# Patient Record
Sex: Female | Born: 1975
Health system: Southern US, Community
[De-identification: ages and names within clinical notes are randomized; demographics above are authoritative.]

## PROBLEM LIST (undated history)

## (undated) DIAGNOSIS — F419 Anxiety disorder, unspecified: Secondary | ICD-10-CM

## (undated) DIAGNOSIS — M797 Fibromyalgia: Secondary | ICD-10-CM

## (undated) DIAGNOSIS — T7840XA Allergy, unspecified, initial encounter: Secondary | ICD-10-CM

## (undated) HISTORY — DX: Anxiety disorder, unspecified: F41.9

## (undated) HISTORY — DX: Allergy, unspecified, initial encounter: T78.40XA

## (undated) HISTORY — PX: CHOLECYSTECTOMY: SHX55

---

## 2009-04-30 ENCOUNTER — Inpatient Hospital Stay (HOSPITAL_COMMUNITY): Admission: AD | Admit: 2009-04-30 | Discharge: 2009-05-02 | Payer: Self-pay | Admitting: Obstetrics and Gynecology

## 2010-09-01 ENCOUNTER — Encounter
Admission: RE | Admit: 2010-09-01 | Discharge: 2010-09-01 | Payer: Self-pay | Source: Home / Self Care | Attending: Family Medicine | Admitting: Family Medicine

## 2010-11-20 LAB — CBC
HCT: 30.1 % — ABNORMAL LOW (ref 36.0–46.0)
HCT: 30.7 % — ABNORMAL LOW (ref 36.0–46.0)
Hemoglobin: 10.5 g/dL — ABNORMAL LOW (ref 12.0–15.0)
Hemoglobin: 11.3 g/dL — ABNORMAL LOW (ref 12.0–15.0)
MCHC: 34.5 g/dL (ref 30.0–36.0)
MCHC: 34.7 g/dL (ref 30.0–36.0)
MCHC: 34.5 g/dL (ref 30.0–36.0)
MCV: 106.3 fL — ABNORMAL HIGH (ref 78.0–100.0)
MCV: 105.1 fL — ABNORMAL HIGH (ref 78.0–100.0)
Platelets: 111 10*3/uL — ABNORMAL LOW (ref 150–400)
Platelets: 142 10*3/uL — ABNORMAL LOW (ref 150–400)
RBC: 3.1 MIL/uL — ABNORMAL LOW (ref 3.87–5.11)
RDW: 12.9 % (ref 11.5–15.5)
RDW: 13.2 % (ref 11.5–15.5)
WBC: 6.1 10*3/uL (ref 4.0–10.5)

## 2010-11-20 LAB — DIFFERENTIAL
Basophils Absolute: 0 10*3/uL (ref 0.0–0.1)
Basophils Absolute: 0 10*3/uL (ref 0.0–0.1)
Basophils Relative: 0 % (ref 0–1)
Basophils Relative: 0 % (ref 0–1)
Eosinophils Absolute: 0.2 10*3/uL (ref 0.0–0.7)
Eosinophils Absolute: 0 10*3/uL (ref 0.0–0.7)
Eosinophils Relative: 0 % (ref 0–5)
Eosinophils Relative: 1 % (ref 0–5)
Lymphocytes Relative: 7 % — ABNORMAL LOW (ref 12–46)
Lymphocytes Relative: 9 % — ABNORMAL LOW (ref 12–46)
Lymphs Abs: 0.7 10*3/uL (ref 0.7–4.0)
Monocytes Absolute: 1 10*3/uL (ref 0.1–1.0)
Monocytes Absolute: 0.7 10*3/uL (ref 0.1–1.0)
Monocytes Relative: 5 % (ref 3–12)

## 2010-11-20 LAB — URINE CULTURE: Special Requests: NEGATIVE

## 2010-11-20 LAB — CULTURE, BLOOD (ROUTINE X 2): Culture: NO GROWTH

## 2010-11-20 LAB — RPR: RPR Ser Ql: NONREACTIVE

## 2011-07-29 ENCOUNTER — Ambulatory Visit (INDEPENDENT_AMBULATORY_CARE_PROVIDER_SITE_OTHER): Payer: BC Managed Care – PPO

## 2011-07-29 DIAGNOSIS — IMO0001 Reserved for inherently not codable concepts without codable children: Secondary | ICD-10-CM

## 2011-07-29 DIAGNOSIS — R059 Cough, unspecified: Secondary | ICD-10-CM

## 2011-07-29 DIAGNOSIS — R05 Cough: Secondary | ICD-10-CM

## 2011-07-29 DIAGNOSIS — J019 Acute sinusitis, unspecified: Secondary | ICD-10-CM

## 2011-09-05 ENCOUNTER — Emergency Department (HOSPITAL_COMMUNITY)
Admission: EM | Admit: 2011-09-05 | Discharge: 2011-09-05 | Disposition: A | Discharge status: Home / Self Care | Payer: BC Managed Care – PPO | Attending: Emergency Medicine | Admitting: Emergency Medicine

## 2011-09-05 ENCOUNTER — Encounter (HOSPITAL_COMMUNITY): Payer: Self-pay | Admitting: *Deleted

## 2011-09-05 DIAGNOSIS — K529 Noninfective gastroenteritis and colitis, unspecified: Secondary | ICD-10-CM

## 2011-09-05 DIAGNOSIS — R112 Nausea with vomiting, unspecified: Secondary | ICD-10-CM | POA: Insufficient documentation

## 2011-09-05 DIAGNOSIS — K5289 Other specified noninfective gastroenteritis and colitis: Secondary | ICD-10-CM | POA: Insufficient documentation

## 2011-09-05 DIAGNOSIS — R197 Diarrhea, unspecified: Secondary | ICD-10-CM | POA: Insufficient documentation

## 2011-09-05 HISTORY — DX: Fibromyalgia: M79.7

## 2011-09-05 MED ORDER — ONDANSETRON 8 MG PO TBDP: 8.0000 mg | ORAL_TABLET | Freq: Three times a day (TID) | ORAL | Status: AC | PRN

## 2011-09-05 MED ORDER — ONDANSETRON HCL 4 MG/2ML IJ SOLN
INTRAMUSCULAR | Status: AC
  Administered 2011-09-05: 4 mg
  Filled 2011-09-05: qty 2

## 2011-09-05 NOTE — ED Provider Notes (Signed)
History     CSN: [id number]  Arrival date & time 09/05/11  0711   First MD Initiated Contact with Patient 09/05/11 0725      Chief Complaint  Patient presents with  . N/V/D     (Consider location/radiation/quality/duration/timing/severity/associated sxs/prior treatment) HPI Jane Fletcher is a 36 y.o. female presents with c/o N/V/D leading to desire to be assessed in the ED. The sx(s) have been present for 6 hours. Additional concerns are nothing. 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 Palliative factors are nothing. The distress associated is mild. The disorder has been present for 6 hours. Past Medical History  Diagnosis Date  . Fibromyalgia     Past Surgical History  Procedure Date  . Cholecystectomy     History reviewed. No pertinent family history.  History  Substance Use Topics  . Smoking status: Never Smoker   . Smokeless tobacco: Not on file  . Alcohol Use: No    OB History    Grav Para Term Preterm Abortions TAB SAB Ect Mult Living                  Review of Systems  All other systems reviewed and are negative.    Allergies  Penicillins  Home Medications   Current Outpatient Rx  Name Route Sig Dispense Refill  . NAPROXEN 500 MG PO TABS Oral Take 500 mg by mouth 2 (two) times daily as needed. For pain    . PSEUDO PO Oral Take 1 tablet by mouth once.    Marland Kitchen ONDANSETRON 8 MG PO TBDP Oral Take 1 tablet (8 mg total) by mouth every 8 (eight) hours as needed for nausea. 12 tablet 0    BP 127/80  Pulse 108  Temp(Src) 98.2 F (36.8 C) (Oral)  Resp 16  SpO2 97%  Physical Exam  Nursing note and vitals reviewed. Constitutional: She is oriented to person, place, and time. She appears well-developed and well-nourished.  HENT:  Head: Normocephalic and atraumatic.  Eyes: Conjunctivae and EOM are normal. Pupils are equal, round, and reactive to light.  Neck: Normal range of motion and phonation normal. Neck supple.  Cardiovascular:  Normal rate, regular rhythm and intact distal pulses.   Pulmonary/Chest: Effort normal and breath sounds normal. She exhibits no tenderness.  Abdominal: Soft. She exhibits no distension. There is no tenderness. There is no guarding.  Musculoskeletal: Normal range of motion.  Neurological: She is alert and oriented to person, place, and time. She has normal strength. She exhibits normal muscle tone.  Skin: Skin is warm and dry.  Psychiatric: She has a normal mood and affect. Her behavior is normal. Judgment and thought content normal.    ED Course  Procedures (including critical care time) ED Treatment: IVF, Zofran- Pt feels better- 08:11 10:00- Tolerating oral fluids.  Labs Reviewed - No data to display No results found.   1. Gastroenteritis       MDM  Evaluation consistent with acute gastroenteritis, patient is stable for d/c. Doubt metabolic instability, serious bacterial infection or dehydration        Flint Melter, MD 09/05/11 1010

## 2011-09-05 NOTE — ED Notes (Signed)
Pt given fluids and tolerated well.  

## 2011-09-05 NOTE — ED Notes (Signed)
Pt reports diarrhea, multiple episodes of vomiting, and fatigue since this am. Pt reports flu like symptoms x a couple days.

## 2012-01-18 ENCOUNTER — Ambulatory Visit: Payer: Self-pay | Admitting: Family Medicine

## 2012-01-18 VITALS — BP 130/86 | HR 92 | Temp 98.6°F | Resp 18 | Ht 63.0 in

## 2012-01-18 DIAGNOSIS — H109 Unspecified conjunctivitis: Secondary | ICD-10-CM

## 2012-01-18 DIAGNOSIS — L0390 Cellulitis, unspecified: Secondary | ICD-10-CM

## 2012-01-18 DIAGNOSIS — L0291 Cutaneous abscess, unspecified: Secondary | ICD-10-CM

## 2012-01-18 MED ORDER — CIPROFLOXACIN HCL 0.3 % OP SOLN: 1.0000 [drp] | OPHTHALMIC | Status: AC

## 2012-01-18 MED ORDER — DOXYCYCLINE HYCLATE 100 MG PO CAPS: 100.0000 mg | ORAL_CAPSULE | Freq: Two times a day (BID) | ORAL | Status: AC

## 2012-01-18 NOTE — Progress Notes (Signed)
   Patient Name: Jane Fletcher Date of Birth: 1976/07/10 Medical Record Number: [id number] Gender: female Date of Encounter: 01/18/2012  History of Present Illness:  Jane Fletcher is a 36 y.o. very pleasant female patient who presents with the following:  She has noted excessive tearing of her left eye for a couple of weeks, then she noted crusting in the am for the last 2 days.  No corrective lenses used.  She notes that her left eye vision is a little blurry at times, but she can blink matter out of the way and be better.  She notes some"throbbing" on the left side of her face which just started today.  No photophobia.  The eye is tender and she notes a "knot" at the nasal corner of her eye.   No other URI symptoms, no fever noted.    LMP 01/10/12  There is no problem list on file for this patient.  Past Medical History  Diagnosis Date  . Fibromyalgia    Past Surgical History  Procedure Date  . Cholecystectomy    History  Substance Use Topics  . Smoking status: Never Smoker   . Smokeless tobacco: Not on file  . Alcohol Use: No   No family history on file. Allergies  Allergen Reactions  . Penicillins Hives    Medication list has been reviewed and updated.  Prior to Admission medications   Medication Sig Start Date End Date Taking? Authorizing Provider  naproxen (NAPROSYN) 500 MG tablet Take 500 mg by mouth 2 (two) times daily as needed. For pain    Historical Provider, MD  Pseudoephedrine HCl (PSEUDO PO) Take 1 tablet by mouth once.    Historical Provider, MD    Review of Systems:  As per HPI- otherwise negative.  Physical Examination: Filed Vitals:   01/18/12 1037  BP: 130/86  Pulse: 92  Temp: 98.6 F (37 C)  Resp: 18   Filed Vitals:   01/18/12 1037  Height: 5\' 3"  (1.6 m)   There is no weight on file to calculate BMI.  GEN: WDWN, NAD, Non-toxic, A & O x 3 HEENT: Atraumatic, Normocephalic. Neck supple. No masses, No LAD.   TM wnl, oropharynx wnl, nasal  cavity congested PEERL, EOMI, fundoscopic exam wnl, fluorescin stain normal.   She has a tender area at the nasal corner of the eye- suspect a blocked tear duct.  Gentle pressure on the globe is not painful, no pain with moving eye in all directions of gaze Ears and Nose: No external deformity. CV: RRR, No M/G/R. No JVD. No thrill. No extra heart sounds. PULM: CTA B, no wheezes, crackles, rhonchi. No retractions. No resp. distress. No accessory muscle use. EXTR: No c/c/e NEURO Normal gait.  PSYCH: Normally interactive. Conversant. Not depressed or anxious appearing.  Calm demeanor.   Assessment and Plan: 1. Conjunctivitis  ciprofloxacin (CILOXAN) 0.3 % ophthalmic solution  2. Cellulitis  doxycycline (VIBRAMYCIN) 100 MG capsule   Will treat as above- suspect that she has a blocked tear duct, and she will use hot compresses and massage also.  Patient (or parent if minor) instructed to return to clinic or call if not better in 2 day(s).  Sooner if worse.      Abbe Amsterdam, MD 01/18/2012 10:58 AM

## 2012-03-21 ENCOUNTER — Telehealth: Payer: Self-pay

## 2012-03-21 NOTE — Telephone Encounter (Signed)
Pt is currently at [facility] for a research study of fibromyalgia , they are needing her last 5 Ov notes as well as labs. I did inform them that we would need a release to be able to fax them that information and they agreed and it should be coming over now. If any questions please call [phone] ext. [phone]

## 2012-03-21 NOTE — Telephone Encounter (Signed)
Faxed over the requested information according to the release to [facility].

## 2012-04-07 ENCOUNTER — Telehealth: Payer: Self-pay

## 2012-04-07 NOTE — Telephone Encounter (Signed)
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  PHONE: [phone]

## 2012-04-09 NOTE — Telephone Encounter (Signed)
There is no reason to test if she does not have evidence of an infection.

## 2012-04-09 NOTE — Telephone Encounter (Signed)
Called pt no vm to leave message

## 2012-04-10 NOTE — Telephone Encounter (Signed)
[facility] notifying patient info below, and to RTC only if has sign of infection.

## 2013-06-23 ENCOUNTER — Emergency Department (HOSPITAL_BASED_OUTPATIENT_CLINIC_OR_DEPARTMENT_OTHER)
Admission: EM | Admit: 2013-06-23 | Discharge: 2013-06-23 | Disposition: A | Discharge status: Home / Self Care | Payer: Self-pay | Attending: Emergency Medicine | Admitting: Emergency Medicine

## 2013-06-23 ENCOUNTER — Encounter (HOSPITAL_BASED_OUTPATIENT_CLINIC_OR_DEPARTMENT_OTHER): Payer: Self-pay | Admitting: Emergency Medicine

## 2013-06-23 ENCOUNTER — Emergency Department (HOSPITAL_BASED_OUTPATIENT_CLINIC_OR_DEPARTMENT_OTHER): Payer: Self-pay

## 2013-06-23 DIAGNOSIS — Z8739 Personal history of other diseases of the musculoskeletal system and connective tissue: Secondary | ICD-10-CM | POA: Insufficient documentation

## 2013-06-23 DIAGNOSIS — Z880 Allergy status to penicillin: Secondary | ICD-10-CM | POA: Insufficient documentation

## 2013-06-23 DIAGNOSIS — J069 Acute upper respiratory infection, unspecified: Secondary | ICD-10-CM | POA: Insufficient documentation

## 2013-06-23 DIAGNOSIS — Z79899 Other long term (current) drug therapy: Secondary | ICD-10-CM | POA: Insufficient documentation

## 2013-06-23 DIAGNOSIS — Z791 Long term (current) use of non-steroidal anti-inflammatories (NSAID): Secondary | ICD-10-CM | POA: Insufficient documentation

## 2013-06-23 MED ORDER — FLUTICASONE PROPIONATE 50 MCG/ACT NA SUSP: 2.0000 | Freq: Every day | NASAL | Status: DC

## 2013-06-23 MED ORDER — BENZONATATE 100 MG PO CAPS: 100.0000 mg | ORAL_CAPSULE | Freq: Three times a day (TID) | ORAL | Status: DC

## 2013-06-23 MED ORDER — GUAIFENESIN ER 600 MG PO TB12: 600.0000 mg | ORAL_TABLET | Freq: Two times a day (BID) | ORAL | Status: DC

## 2013-06-23 NOTE — ED Notes (Signed)
Cough x 1 day, chest pain with deep breath

## 2013-06-23 NOTE — ED Provider Notes (Signed)
CSN: [id number]     Arrival date & time 06/23/13  0509 History   None    Chief Complaint  Patient presents with  . Cough   (Consider location/radiation/quality/duration/timing/severity/associated sxs/prior Treatment) Patient is a 37 y.o. female presenting with cough. The history is provided by the patient.  Cough Cough characteristics:  Non-productive Severity:  Mild Onset quality:  Gradual Duration:  1 day Timing:  Intermittent Progression:  Unchanged Chronicity:  New Smoker: no   Context: sick contacts and upper respiratory infection   Relieved by:  Nothing Worsened by:  Nothing tried Ineffective treatments:  None tried Associated symptoms: rhinorrhea, sinus congestion and sore throat   Associated symptoms: no chest pain, no diaphoresis, no ear pain, no fever, no headaches, no rash, no shortness of breath and no wheezing   Associated symptoms comment:  Only has pain with coughing Risk factors: no recent travel   No rashes no neck pain or stiffness. No f/c/r. No DOE, no SOB no n/v/d.  No leg swelling no OCPs.  Feels facially congested as well.  Past Medical History  Diagnosis Date  . Fibromyalgia    Past Surgical History  Procedure Laterality Date  . Cholecystectomy     No family history on file. History  Substance Use Topics  . Smoking status: Never Smoker   . Smokeless tobacco: Never Used  . Alcohol Use: No   OB History   Grav Para Term Preterm Abortions TAB SAB Ect Mult Living                 Review of Systems  Constitutional: Negative for fever and diaphoresis.  HENT: Positive for postnasal drip, rhinorrhea and sore throat. Negative for ear pain and trouble swallowing.   Respiratory: Positive for cough. Negative for shortness of breath and wheezing.   Cardiovascular: Negative for chest pain, palpitations and leg swelling.  Skin: Negative for rash.  Neurological: Negative for headaches.  All other systems reviewed and are negative.    Allergies   Penicillins  Home Medications   Current Outpatient Rx  Name  Route  Sig  Dispense  Refill  . acetaminophen (TYLENOL) 500 MG tablet   Oral   Take 500 mg by mouth every 6 (six) hours as needed.         . gabapentin (NEURONTIN) 400 MG capsule   Oral   Take 400 mg by mouth 2 (two) times daily.         . meloxicam (MOBIC) 15 MG tablet   Oral   Take 15 mg by mouth daily.         . naproxen (NAPROSYN) 500 MG tablet   Oral   Take 500 mg by mouth 2 (two) times daily as needed. For pain         . Pseudoephedrine HCl (PSEUDO PO)   Oral   Take 1 tablet by mouth once.          BP 152/81  Pulse 95  Temp(Src) 98.1 F (36.7 C)  Resp 20  Ht 5\' 3"  (1.6 m)  Wt 182 lb (82.555 kg)  BMI 32.25 kg/m2  SpO2 98%  LMP 06/06/2013 Physical Exam  Constitutional: She is oriented to person, place, and time. She appears well-developed and well-nourished. No distress.  HENT:  Head: Normocephalic and atraumatic.  Mouth/Throat: Oropharynx is clear and moist.  Cobblestoning and clear drainage consistent with post nasal drip  Eyes: Conjunctivae are normal. Pupils are equal, round, and reactive to light.  Neck: Normal range  of motion. Neck supple.  Cardiovascular: Normal rate, regular rhythm and intact distal pulses.   Pulmonary/Chest: Effort normal and breath sounds normal. No stridor. No respiratory distress. She has no wheezes. She has no rales. She exhibits no tenderness.  Abdominal: Soft. Bowel sounds are normal. There is no tenderness. There is no rebound and no guarding.  Musculoskeletal: Normal range of motion. She exhibits no edema.  Lymphadenopathy:    She has no cervical adenopathy.  Neurological: She is alert and oriented to person, place, and time.  Skin: Skin is warm and dry.    ED Course  Procedures (including critical care time) Labs Review Labs Reviewed - No data to display Imaging Review No results found.  EKG Interpretation   None       MDM  No diagnosis  found. PERC negative wells 0.  As the patient only has pain with coughing and other URI and no SOB, DOE, n/v/d.  Highly doubt cardiac etiology.  This is a constellation of viral symptoms.  Coworker with same will treat symptomatically   Selenia Mihok K Cala Kruckenberg-Rasch, MD 06/23/13 [phone]

## 2013-06-23 NOTE — ED Notes (Signed)
Patient transported to X-ray 

## 2013-06-23 NOTE — ED Notes (Signed)
MD at bedside during triage assessment

## 2013-06-23 NOTE — ED Notes (Signed)
rx x 3 given for tessalon, flonsae and mucinex

## 2014-05-28 ENCOUNTER — Emergency Department (HOSPITAL_BASED_OUTPATIENT_CLINIC_OR_DEPARTMENT_OTHER): Payer: 59

## 2014-05-28 ENCOUNTER — Emergency Department (HOSPITAL_BASED_OUTPATIENT_CLINIC_OR_DEPARTMENT_OTHER)
Admission: EM | Admit: 2014-05-28 | Discharge: 2014-05-28 | Disposition: A | Discharge status: Home / Self Care | Payer: 59 | Attending: Emergency Medicine | Admitting: Emergency Medicine

## 2014-05-28 ENCOUNTER — Other Ambulatory Visit: Payer: Self-pay

## 2014-05-28 ENCOUNTER — Encounter (HOSPITAL_BASED_OUTPATIENT_CLINIC_OR_DEPARTMENT_OTHER): Payer: Self-pay | Admitting: Emergency Medicine

## 2014-05-28 DIAGNOSIS — Z880 Allergy status to penicillin: Secondary | ICD-10-CM | POA: Diagnosis not present

## 2014-05-28 DIAGNOSIS — Z79899 Other long term (current) drug therapy: Secondary | ICD-10-CM | POA: Insufficient documentation

## 2014-05-28 DIAGNOSIS — J069 Acute upper respiratory infection, unspecified: Secondary | ICD-10-CM | POA: Insufficient documentation

## 2014-05-28 DIAGNOSIS — R0789 Other chest pain: Secondary | ICD-10-CM | POA: Diagnosis not present

## 2014-05-28 DIAGNOSIS — Z791 Long term (current) use of non-steroidal anti-inflammatories (NSAID): Secondary | ICD-10-CM | POA: Insufficient documentation

## 2014-05-28 DIAGNOSIS — R05 Cough: Secondary | ICD-10-CM | POA: Diagnosis present

## 2014-05-28 DIAGNOSIS — J018 Other acute sinusitis: Secondary | ICD-10-CM | POA: Insufficient documentation

## 2014-05-28 DIAGNOSIS — Z8739 Personal history of other diseases of the musculoskeletal system and connective tissue: Secondary | ICD-10-CM | POA: Diagnosis not present

## 2014-05-28 MED ORDER — AZITHROMYCIN 250 MG PO TABS: 250.0000 mg | ORAL_TABLET | Freq: Every day | ORAL | Status: DC

## 2014-05-28 MED ORDER — FLUTICASONE PROPIONATE 50 MCG/ACT NA SUSP: 2.0000 | Freq: Every day | NASAL | Status: DC

## 2014-05-28 NOTE — ED Provider Notes (Signed)
CSN: [id number]     Arrival date & time 05/28/14  1314 History   First MD Initiated Contact with Patient 05/28/14 1432     Chief Complaint  Patient presents with  . URI     (Consider location/radiation/quality/duration/timing/severity/associated sxs/prior Treatment) HPI Comments: This is a 38 y/o F with a past medical history of fibromyalgia who presents to the emergency department complaining of cough x1 week, worsening over the past today's. Cough is only occasionally productive with mucus. States she's experiencing chest tightness when she coughs and brains, nasal congestion and sinus pressure. She has tried taking over-the-counter sinus decongestant with minimal relief. Admits to chills without fever. Denies nausea or vomiting. States her whole body is aching.  Patient is a 38 y.o. female presenting with URI. The history is provided by the patient.  URI Presenting symptoms: congestion and cough   Associated symptoms: no wheezing     Past Medical History  Diagnosis Date  . Fibromyalgia    Past Surgical History  Procedure Laterality Date  . Cholecystectomy     No family history on file. History  Substance Use Topics  . Smoking status: Never Smoker   . Smokeless tobacco: Never Used  . Alcohol Use: No   OB History   Grav Para Term Preterm Abortions TAB SAB Ect Mult Living                 Review of Systems  HENT: Positive for congestion and sinus pressure.   Respiratory: Positive for cough and chest tightness. Negative for wheezing.   All other systems reviewed and are negative.     Allergies  Penicillins  Home Medications   Prior to Admission medications   Medication Sig Start Date End Date Taking? Authorizing Provider  acetaminophen (TYLENOL) 500 MG tablet Take 500 mg by mouth every 6 (six) hours as needed.    Historical Provider, MD  azithromycin (ZITHROMAX) 250 MG tablet Take 1 tablet (250 mg total) by mouth daily. Take first 2 tablets together, then 1 every  day until finished. 05/28/14   Kahliya Fraleigh M Guliana Weyandt, PA-C  benzonatate (TESSALON) 100 MG capsule Take 1 capsule (100 mg total) by mouth every 8 (eight) hours. 06/23/13   April K Palumbo-Rasch, MD  fluticasone [facility](FLONASE) 50 MCG/ACT nasal spray Place 2 sprays into both nostrils daily. 06/23/13   April K Palumbo-Rasch, MD  fluticasone [facility](FLONASE) 50 MCG/ACT nasal spray Place 2 sprays into both nostrils daily. 05/28/14   Cristhian Vanhook M Jullien Granquist, PA-C  gabapentin (NEURONTIN) 400 MG capsule Take 400 mg by mouth 2 (two) times daily.    Historical Provider, MD  guaiFENesin (MUCINEX) 600 MG 12 hr tablet Take 1 tablet (600 mg total) by mouth 2 (two) times daily. 06/23/13   April K Palumbo-Rasch, MD  meloxicam (MOBIC) 15 MG tablet Take 15 mg by mouth daily.    Historical Provider, MD  naproxen (NAPROSYN) 500 MG tablet Take 500 mg by mouth 2 (two) times daily as needed. For pain    Historical Provider, MD  Pseudoephedrine HCl (PSEUDO PO) Take 1 tablet by mouth once.    Historical Provider, MD   BP 141/87  Pulse 100  Temp(Src) 98.8 F (37.1 C) (Oral)  Resp 20  Wt 182 lb (82.555 kg)  SpO2 97%  LMP 05/18/2014 Physical Exam  Nursing note and vitals reviewed. Constitutional: She is oriented to person, place, and time. She appears well-developed and well-nourished. No distress.  HENT:  Head: Normocephalic and atraumatic.  Nose: Mucosal edema present. Right  sinus exhibits maxillary sinus tenderness and frontal sinus tenderness. Left sinus exhibits maxillary sinus tenderness and frontal sinus tenderness.  Mouth/Throat: Uvula is midline.  BL TM retracted. No erythema. Post nasal drip.  Eyes: Conjunctivae and EOM are normal.  Neck: Normal range of motion. Neck supple.  Cardiovascular: Normal rate, regular rhythm and normal heart sounds.   Pulmonary/Chest: Effort normal and breath sounds normal. No respiratory distress. She has no wheezes. She has no rales.  Musculoskeletal: Normal range of motion. She exhibits no edema.   Neurological: She is alert and oriented to person, place, and time. No sensory deficit.  Skin: Skin is warm and dry.  Psychiatric: She has a normal mood and affect. Her behavior is normal.    ED Course  Procedures (including critical care time) Labs Review Labs Reviewed - No data to display  Imaging Review Dg Chest 2 View  05/28/2014   CLINICAL DATA:  Cough and chest congestion for 1 week. Headache today.  EXAM: CHEST  2 VIEW  COMPARISON:  06/23/2013  FINDINGS: Heart size and pulmonary vascularity are normal. No infiltrates or effusions. Chronic blunting of the costophrenic angles laterally at both bases. No osseous abnormality.  IMPRESSION: No acute abnormalities.   Electronically Signed   By: Geanie CooleyJim  Maxwell M.D.   On: 05/28/2014 13:58     EKG Interpretation None      MDM   Final diagnoses:  Other acute sinusitis  URI (upper respiratory infection)   Patient nontoxic appearing and in no apparent distress. Afebrile, vital signs stable. On Saturday on my exam. Lungs clear. Chest x-ray obtained prior to patient being seen, no acute findings. Given symptoms have been present for a week, unresolved with over-the-counter medications, will treat with antibiotics and nasal spray. Followup with PCP. Stable for discharge. Return precautions given. Patient states understanding of treatment care plan and is agreeable.  Kathrynn SpeedRobyn M Channing Yeager, PA-C 05/28/14 1505

## 2014-05-28 NOTE — ED Notes (Signed)
Cough, aching all over, tightness in her chest when she breathes. Hx of pneumonia in the past.

## 2014-05-28 NOTE — Discharge Instructions (Signed)
Take antibiotic azithromycin to completion. Use nasal spray as directed. Rest, rinse nasal saline through your nose.  Sinusitis Sinusitis is redness, soreness, and inflammation of the paranasal sinuses. Paranasal sinuses are air pockets within the bones of your face (beneath the eyes, the middle of the forehead, or above the eyes). In healthy paranasal sinuses, mucus is able to drain out, and air is able to circulate through them by way of your nose. However, when your paranasal sinuses are inflamed, mucus and air can become trapped. This can allow bacteria and other germs to grow and cause infection. Sinusitis can develop quickly and last only a short time (acute) or continue over a long period (chronic). Sinusitis that lasts for more than 12 weeks is considered chronic.  CAUSES  Causes of sinusitis include:  Allergies.  Structural abnormalities, such as displacement of the cartilage that separates your nostrils (deviated septum), which can decrease the air flow through your nose and sinuses and affect sinus drainage.  Functional abnormalities, such as when the small hairs (cilia) that line your sinuses and help remove mucus do not work properly or are not present. SIGNS AND SYMPTOMS  Symptoms of acute and chronic sinusitis are the same. The primary symptoms are pain and pressure around the affected sinuses. Other symptoms include:  Upper toothache.  Earache.  Headache.  Bad breath.  Decreased sense of smell and taste.  A cough, which worsens when you are lying flat.  Fatigue.  Fever.  Thick drainage from your nose, which often is green and may contain pus (purulent).  Swelling and warmth over the affected sinuses. DIAGNOSIS  Your health care provider will perform a physical exam. During the exam, your health care provider may:  Look in your nose for signs of abnormal growths in your nostrils (nasal polyps).  Tap over the affected sinus to check for signs of  infection.  View the inside of your sinuses (endoscopy) using an imaging device that has a light attached (endoscope). If your health care provider suspects that you have chronic sinusitis, one or more of the following tests may be recommended:  Allergy tests.  Nasal culture. A sample of mucus is taken from your nose, sent to a lab, and screened for bacteria.  Nasal cytology. A sample of mucus is taken from your nose and examined by your health care provider to determine if your sinusitis is related to an allergy. TREATMENT  Most cases of acute sinusitis are related to a viral infection and will resolve on their own within 10 days. Sometimes medicines are prescribed to help relieve symptoms (pain medicine, decongestants, nasal steroid sprays, or saline sprays).  However, for sinusitis related to a bacterial infection, your health care provider will prescribe antibiotic medicines. These are medicines that will help kill the bacteria causing the infection.  Rarely, sinusitis is caused by a fungal infection. In theses cases, your health care provider will prescribe antifungal medicine. For some cases of chronic sinusitis, surgery is needed. Generally, these are cases in which sinusitis recurs more than 3 times per year, despite other treatments. HOME CARE INSTRUCTIONS   Drink plenty of water. Water helps thin the mucus so your sinuses can drain more easily.  Use a humidifier.  Inhale steam 3 to 4 times a day (for example, sit in the bathroom with the shower running).  Apply a warm, moist washcloth to your face 3 to 4 times a day, or as directed by your health care provider.  Use saline nasal sprays to  help moisten and clean your sinuses.  Take medicines only as directed by your health care provider.  If you were prescribed either an antibiotic or antifungal medicine, finish it all even if you start to feel better. SEEK IMMEDIATE MEDICAL CARE IF:  You have increasing pain or severe  headaches.  You have nausea, vomiting, or drowsiness.  You have swelling around your face.  You have vision problems.  You have a stiff neck.  You have difficulty breathing. MAKE SURE YOU:   Understand these instructions.  Will watch your condition.  Will get help right away if you are not doing well or get worse. Document Released: 08/02/2005 Document Revised: 12/17/2013 Document Reviewed: 08/17/2011 [facility] Patient Information 2015 [place], [place]. This information is not intended to replace advice given to you by your health care provider. Make sure you discuss any questions you have with your health care provider.  Upper Respiratory Infection, Adult An upper respiratory infection (URI) is also sometimes known as the common cold. The upper respiratory tract includes the nose, sinuses, throat, trachea, and bronchi. Bronchi are the airways leading to the lungs. Most people improve within 1 week, but symptoms can last up to 2 weeks. A residual cough may last even longer.  CAUSES Many different viruses can infect the tissues lining the upper respiratory tract. The tissues become irritated and inflamed and often become very moist. Mucus production is also common. A cold is contagious. You can easily spread the virus to others by oral contact. This includes kissing, sharing a glass, coughing, or sneezing. Touching your mouth or nose and then touching a surface, which is then touched by another person, can also spread the virus. SYMPTOMS  Symptoms typically develop 1 to 3 days after you come in contact with a cold virus. Symptoms vary from person to person. They may include:  Runny nose.  Sneezing.  Nasal congestion.  Sinus irritation.  Sore throat.  Loss of voice (laryngitis).  Cough.  Fatigue.  Muscle aches.  Loss of appetite.  Headache.  Low-grade fever. DIAGNOSIS  You might diagnose your own cold based on familiar symptoms, since most people get a cold 2 to 3  times a year. Your caregiver can confirm this based on your exam. Most importantly, your caregiver can check that your symptoms are not due to another disease such as strep throat, sinusitis, pneumonia, asthma, or epiglottitis. Blood tests, throat tests, and X-rays are not necessary to diagnose a common cold, but they may sometimes be helpful in excluding other more serious diseases. Your caregiver will decide if any further tests are required. RISKS AND COMPLICATIONS  You may be at risk for a more severe case of the common cold if you smoke cigarettes, have chronic heart disease (such as heart failure) or lung disease (such as asthma), or if you have a weakened immune system. The very young and very old are also at risk for more serious infections. Bacterial sinusitis, middle ear infections, and bacterial pneumonia can complicate the common cold. The common cold can worsen asthma and chronic obstructive pulmonary disease (COPD). Sometimes, these complications can require emergency medical care and may be life-threatening. PREVENTION  The best way to protect against getting a cold is to practice good hygiene. Avoid oral or hand contact with people with cold symptoms. Wash your hands often if contact occurs. There is no clear evidence that vitamin C, vitamin E, echinacea, or exercise reduces the chance of developing a cold. However, it is always recommended to get  plenty of rest and practice good nutrition. TREATMENT  Treatment is directed at relieving symptoms. There is no cure. Antibiotics are not effective, because the infection is caused by a virus, not by bacteria. Treatment may include:  Increased fluid intake. Sports drinks offer valuable electrolytes, sugars, and fluids.  Breathing heated mist or steam (vaporizer or shower).  Eating chicken soup or other clear broths, and maintaining good nutrition.  Getting plenty of rest.  Using gargles or lozenges for comfort.  Controlling fevers with  ibuprofen or acetaminophen as directed by your caregiver.  Increasing usage of your inhaler if you have asthma. Zinc gel and zinc lozenges, taken in the first 24 hours of the common cold, can shorten the duration and lessen the severity of symptoms. Pain medicines may help with fever, muscle aches, and throat pain. A variety of non-prescription medicines are available to treat congestion and runny nose. Your caregiver can make recommendations and may suggest nasal or lung inhalers for other symptoms.  HOME CARE INSTRUCTIONS   Only take over-the-counter or prescription medicines for pain, discomfort, or fever as directed by your caregiver.  Use a warm mist humidifier or inhale steam from a shower to increase air moisture. This may keep secretions moist and make it easier to breathe.  Drink enough water and fluids to keep your urine clear or pale yellow.  Rest as needed.  Return to work when your temperature has returned to normal or as your caregiver advises. You may need to stay home longer to avoid infecting others. You can also use a face mask and careful hand washing to prevent spread of the virus. SEEK MEDICAL CARE IF:   After the first few days, you feel you are getting worse rather than better.  You need your caregiver's advice about medicines to control symptoms.  You develop chills, worsening shortness of breath, or brown or red sputum. These may be signs of pneumonia.  You develop yellow or brown nasal discharge or pain in the face, especially when you bend forward. These may be signs of sinusitis.  You develop a fever, swollen neck glands, pain with swallowing, or white areas in the back of your throat. These may be signs of strep throat. SEEK IMMEDIATE MEDICAL CARE IF:   You have a fever.  You develop severe or persistent headache, ear pain, sinus pain, or chest pain.  You develop wheezing, a prolonged cough, cough up blood, or have a change in your usual mucus (if you have  chronic lung disease).  You develop sore muscles or a stiff neck. Document Released: 01/26/2001 Document Revised: 10/25/2011 Document Reviewed: 11/07/2013 [facility] Patient Information 2015 [place]Exit[place], [place]LL[place]. This information is not intended to replace advice given to you by your health care provider. Make sure you discuss any questions you have with your health care provider.

## 2014-05-28 NOTE — ED Provider Notes (Signed)
Medical screening examination/treatment/procedure(s) were performed by non-physician practitioner and as supervising physician I was immediately available for consultation/collaboration.  Toy CookeyMegan Jonika Critz, MD 05/28/14 [phone]

## 2014-11-20 ENCOUNTER — Encounter (HOSPITAL_BASED_OUTPATIENT_CLINIC_OR_DEPARTMENT_OTHER): Payer: Self-pay | Admitting: Emergency Medicine

## 2014-11-20 ENCOUNTER — Emergency Department (HOSPITAL_BASED_OUTPATIENT_CLINIC_OR_DEPARTMENT_OTHER)
Admission: EM | Admit: 2014-11-20 | Discharge: 2014-11-20 | Disposition: A | Discharge status: Home / Self Care | Payer: Managed Care, Other (non HMO) | Attending: Emergency Medicine | Admitting: Emergency Medicine

## 2014-11-20 DIAGNOSIS — Z791 Long term (current) use of non-steroidal anti-inflammatories (NSAID): Secondary | ICD-10-CM | POA: Diagnosis not present

## 2014-11-20 DIAGNOSIS — Y9389 Activity, other specified: Secondary | ICD-10-CM | POA: Diagnosis not present

## 2014-11-20 DIAGNOSIS — Y998 Other external cause status: Secondary | ICD-10-CM | POA: Diagnosis not present

## 2014-11-20 DIAGNOSIS — M797 Fibromyalgia: Secondary | ICD-10-CM | POA: Insufficient documentation

## 2014-11-20 DIAGNOSIS — Z79899 Other long term (current) drug therapy: Secondary | ICD-10-CM | POA: Diagnosis not present

## 2014-11-20 DIAGNOSIS — Y9289 Other specified places as the place of occurrence of the external cause: Secondary | ICD-10-CM | POA: Diagnosis not present

## 2014-11-20 DIAGNOSIS — Z7951 Long term (current) use of inhaled steroids: Secondary | ICD-10-CM | POA: Diagnosis not present

## 2014-11-20 DIAGNOSIS — S0502XA Injury of conjunctiva and corneal abrasion without foreign body, left eye, initial encounter: Secondary | ICD-10-CM | POA: Diagnosis not present

## 2014-11-20 DIAGNOSIS — Z880 Allergy status to penicillin: Secondary | ICD-10-CM | POA: Diagnosis not present

## 2014-11-20 DIAGNOSIS — S0592XA Unspecified injury of left eye and orbit, initial encounter: Secondary | ICD-10-CM | POA: Diagnosis present

## 2014-11-20 DIAGNOSIS — X58XXXA Exposure to other specified factors, initial encounter: Secondary | ICD-10-CM | POA: Diagnosis not present

## 2014-11-20 DIAGNOSIS — Z792 Long term (current) use of antibiotics: Secondary | ICD-10-CM | POA: Diagnosis not present

## 2014-11-20 MED ORDER — FLUORESCEIN SODIUM 1 MG OP STRP
ORAL_STRIP | OPHTHALMIC | Status: AC
  Administered 2014-11-20: 05:00:00
  Filled 2014-11-20: qty 1

## 2014-11-20 MED ORDER — OXYCODONE-ACETAMINOPHEN 5-325 MG PO TABS: 1.0000 | ORAL_TABLET | ORAL | Status: DC | PRN

## 2014-11-20 MED ORDER — TETRACAINE HCL 0.5 % OP SOLN
OPHTHALMIC | Status: AC
  Administered 2014-11-20: 05:00:00
  Filled 2014-11-20: qty 2

## 2014-11-20 MED ORDER — ERYTHROMYCIN 5 MG/GM OP OINT: TOPICAL_OINTMENT | Freq: Four times a day (QID) | OPHTHALMIC | Status: DC

## 2014-11-20 NOTE — ED Provider Notes (Signed)
CSN: [id number]     Arrival date & time 11/20/14  0032 History   First MD Initiated Contact with Patient 11/20/14 [phone]     Chief Complaint  Patient presents with  . Eye Pain     (Consider location/radiation/quality/duration/timing/severity/associated sxs/prior Treatment) Patient is a 39 y.o. female presenting with eye pain. The history is provided by the patient.  Eye Pain  She was washing her hair and got some shampoo in her left eye and about 5 PM. She tried washing and thinks she may have scratched her eye. She states it feels like there is an in her eye. There is no difficulty with her vision.  Past Medical History  Diagnosis Date  . Fibromyalgia    Past Surgical History  Procedure Laterality Date  . Cholecystectomy     History reviewed. No pertinent family history. History  Substance Use Topics  . Smoking status: Never Smoker   . Smokeless tobacco: Never Used  . Alcohol Use: No   OB History    No data available     Review of Systems  Eyes: Positive for pain.  All other systems reviewed and are negative.     Allergies  Penicillins  Home Medications   Prior to Admission medications   Medication Sig Start Date End Date Taking? Authorizing Provider  acetaminophen (TYLENOL) 500 MG tablet Take 500 mg by mouth every 6 (six) hours as needed.    Historical Provider, MD  azithromycin (ZITHROMAX) 250 MG tablet Take 1 tablet (250 mg total) by mouth daily. Take first 2 tablets together, then 1 every day until finished. 05/28/14   Robyn M Hess, PA-C  benzonatate (TESSALON) 100 MG capsule Take 1 capsule (100 mg total) by mouth every 8 (eight) hours. 06/23/13   April Palumbo, MD  fluticasone [facility](FLONASE) 50 MCG/ACT nasal spray Place 2 sprays into both nostrils daily. 06/23/13   April Palumbo, MD  fluticasone [facility](FLONASE) 50 MCG/ACT nasal spray Place 2 sprays into both nostrils daily. 05/28/14   Robyn M Hess, PA-C  gabapentin (NEURONTIN) 400 MG capsule Take 400 mg by mouth 2 (two)  times daily.    Historical Provider, MD  guaiFENesin (MUCINEX) 600 MG 12 hr tablet Take 1 tablet (600 mg total) by mouth 2 (two) times daily. 06/23/13   April Palumbo, MD  meloxicam (MOBIC) 15 MG tablet Take 15 mg by mouth daily.    Historical Provider, MD  naproxen (NAPROSYN) 500 MG tablet Take 500 mg by mouth 2 (two) times daily as needed. For pain    Historical Provider, MD  Pseudoephedrine HCl (PSEUDO PO) Take 1 tablet by mouth once.    Historical Provider, MD   BP 126/76 mmHg  Pulse 89  Temp(Src) 98.3 F (36.8 C) (Oral)  Resp 18  Ht 5\' 3"  (1.6 m)  Wt 183 lb (83.008 kg)  BMI 32.43 kg/m2  SpO2 100%  LMP 10/29/2014 Physical Exam  Nursing note and vitals reviewed.  39 year old female, resting comfortably and in no acute distress. Vital signs are normal. Oxygen saturation is 100%, which is normal. Head is normocephalic and atraumatic. PERRLA, EOMI. Oropharynx is clear. There is mild erythema of the conjunctiva left eye. No foreign body is seen. Anterior chambers clear. Neck is nontender and supple without adenopathy or JVD. Back is nontender and there is no CVA tenderness. Lungs are clear without rales, wheezes, or rhonchi. Chest is nontender. Heart has regular rate and rhythm without murmur. Abdomen is soft, flat, nontender without masses or hepatosplenomegaly and  peristalsis is normoactive. Extremities have no cyanosis or edema, full range of motion is present. Skin is warm and dry without rash. Neurologic: Mental status is normal, cranial nerves are intact, there are no motor or sensory deficits.  ED Course  Procedures (including critical care time) Slit-lamp examination shows no foreign body, anterior chamber clear. Eye stained with fluorescein and examined with cobalt blue filter and numerous punctate areas of increased uptake are seen on the cornea of the left eye.  MDM   Final diagnoses:  Corneal abrasion, left, initial encounter    Corneal abrasions of the left eye.  This seems much more likely to have been from soap than from scratch. She is given prescription for erythromycin ophthalmic ointment and also prescription for oxycodone have acetaminophen for pain. She is referred to ophthalmology for follow-up if not improving in the next 2-3 days.    Dione Booze, MD 11/20/14 [phone]

## 2014-11-20 NOTE — Discharge Instructions (Signed)
Corneal Abrasion The cornea is the clear covering at the front and center of the eye. When looking at the colored portion of the eye (iris), you are looking through the cornea. This very thin tissue is made up of many layers. The surface layer is a single layer of cells (corneal epithelium) and is one of the most sensitive tissues in the body. If a scratch or injury causes the corneal epithelium to come off, it is called a corneal abrasion. If the injury extends to the tissues below the epithelium, the condition is called a corneal ulcer. CAUSES   Scratches.  Trauma.  Foreign body in the eye. Some people have recurrences of abrasions in the area of the original injury even after it has healed (recurrent erosion syndrome). Recurrent erosion syndrome generally improves and goes away with time. SYMPTOMS   Eye pain.  Difficulty or inability to keep the injured eye open.  The eye becomes very sensitive to light.  Recurrent erosions tend to happen suddenly, first thing in the morning, usually after waking up and opening the eye. DIAGNOSIS  Your health care provider can diagnose a corneal abrasion during an eye exam. Dye is usually placed in the eye using a drop or a small paper strip moistened by your tears. When the eye is examined with a special light, the abrasion shows up clearly because of the dye. TREATMENT   Small abrasions may be treated with antibiotic drops or ointment alone.  A pressure patch may be put over the eye. If this is done, follow your doctor's instructions for when to remove the patch. Do not drive or use machines while the eye patch is on. Judging distances is hard to do with a patch on. If the abrasion becomes infected and spreads to the deeper tissues of the cornea, a corneal ulcer can result. This is serious because it can cause corneal scarring. Corneal scars interfere with light passing through the cornea and cause a loss of vision in the involved eye. HOME CARE  INSTRUCTIONS  Use medicine or ointment as directed. Only take over-the-counter or prescription medicines for pain, discomfort, or fever as directed by your health care provider.  Do not drive or operate machinery if your eye is patched. Your ability to judge distances is impaired.  If your health care provider has given you a follow-up appointment, it is very important to keep that appointment. Not keeping the appointment could result in a severe eye infection or permanent loss of vision. If there is any problem keeping the appointment, let your health care provider know. SEEK MEDICAL CARE IF:   You have pain, light sensitivity, and a scratchy feeling in one eye or both eyes.  Your pressure patch keeps loosening up, and you can blink your eye under the patch after treatment.  Any kind of discharge develops from the eye after treatment or if the lids stick together in the morning.  You have the same symptoms in the morning as you did with the original abrasion days, weeks, or months after the abrasion healed. MAKE SURE YOU:   Understand these instructions.  Will watch your condition.  Will get help right away if you are not doing well or get worse. Document Released: 07/30/2000 Document Revised: 08/07/2013 Document Reviewed: 04/09/2013 [facility] Patient Information 2015 [place], [place]. This information is not intended to replace advice given to you by your health care provider. Make sure you discuss any questions you have with your health care provider.  Erythromycin eye  ointment What is this medicine? ERYTHROMYCIN (er ith roe MYE sin) is a macrolide antibiotic. It is used to treat bacterial eye infections. It also prevents a certain type of eye infection that can occur in some babies. This medicine may be used for other purposes; ask your health care provider or pharmacist if you have questions. COMMON BRAND NAME(S): Ilotycin, Romycin What should I tell my health care provider before I  take this medicine? -if you have an unusual or allergic reaction to erythromycin, foods, dyes, or preservatives -pregnant or trying to get pregnant -breast-feeding How should I use this medicine? This medicine is only for use in the eye. Follow the directions on the prescription label. Wash hands before and after use. Tilt your head back slightly and pull your lower eyelid down with your index finger to form a pouch. Try not to touch the tip of the tube, to your eye, fingertips, or any other surface. Squeeze the end of the tube to apply a thin layer of the ointment to the inside of the lower eyelid. Close the eye gently to spread the ointment. Your vision may blur for a few minutes. Use your doses at regular intervals. Do not use your medicine more often than directed. Finish the full course prescribed by your doctor or health care professional even if you think your condition is better. Do not stop using except on the advice of your doctor or health care professional. Talk to your pediatrician regarding the use of this medicine in children. Special care may be needed. Overdosage: If you think you have taken too much of this medicine contact a poison control center or emergency room at once. NOTE: This medicine is only for you. Do not share this medicine with others. What if I miss a dose? If you miss a dose, use it as soon as you can. If it is almost time for your next dose, use only that dose. Do not use double or extra doses. What may interact with this medicine? Interactions are not expected. Do not use any other eye products without telling your doctor or health care professional. This list may not describe all possible interactions. Give your health care provider a list of all the medicines, herbs, non-prescription drugs, or dietary supplements you use. Also tell them if you smoke, drink alcohol, or use illegal drugs. Some items may interact with your medicine. What should I watch for while using  this medicine? Tell your doctor or health care professional if your symptoms do not improve in 2 to 3 days. What side effects may I notice from receiving this medicine? Side effects that you should report to your doctor or health care professional as soon as possible: -allergic reactions like skin rash, itching or hives, swelling of the face, lips, or tongue -burning, stinging, or itching of the eyes or eyelids -changes in vision -redness, swelling, or pain This list may not describe all possible side effects. Call your doctor for medical advice about side effects. You may report side effects to FDA at 1-800-FDA-1088. Where should I keep my medicine? Keep out of the reach of children. Store at room temperature between 15 and 30 degrees C (59 and 86 degrees F). Do not freeze. Throw away any unused ointment after the expiration date. NOTE: This sheet is a summary. It may not cover all possible information. If you have questions about this medicine, talk to your doctor, pharmacist, or health care provider.  2015, Elsevier/Gold Standard. (2007-12-04 17:17:39)  Acetaminophen;  Oxycodone tablets What is this medicine? ACETAMINOPHEN; OXYCODONE (a set a MEE noe fen; ox i KOE done) is a pain reliever. It is used to treat mild to moderate pain. This medicine may be used for other purposes; ask your health care provider or pharmacist if you have questions. COMMON BRAND NAME(S): Endocet, Magnacet, Narvox, Percocet, Perloxx, Primalev, Primlev, Roxicet, Xolox What should I tell my health care provider before I take this medicine? They need to know if you have any of these conditions: -brain tumor -Crohn's disease, inflammatory bowel disease, or ulcerative colitis -drug abuse or addiction -head injury -heart or circulation problems -if you often drink alcohol -kidney disease or problems going to the bathroom -liver disease -lung disease, asthma, or breathing problems -an unusual or allergic reaction  to acetaminophen, oxycodone, other opioid analgesics, other medicines, foods, dyes, or preservatives -pregnant or trying to get pregnant -breast-feeding How should I use this medicine? Take this medicine by mouth with a full glass of water. Follow the directions on the prescription label. Take your medicine at regular intervals. Do not take your medicine more often than directed. Talk to your pediatrician regarding the use of this medicine in children. Special care may be needed. Patients over 56 years old may have a stronger reaction and need a smaller dose. Overdosage: If you think you have taken too much of this medicine contact a poison control center or emergency room at once. NOTE: This medicine is only for you. Do not share this medicine with others. What if I miss a dose? If you miss a dose, take it as soon as you can. If it is almost time for your next dose, take only that dose. Do not take double or extra doses. What may interact with this medicine? -alcohol -antihistamines -barbiturates like amobarbital, butalbital, butabarbital, methohexital, pentobarbital, phenobarbital, thiopental, and secobarbital -benztropine -drugs for bladder problems like solifenacin, trospium, oxybutynin, tolterodine, hyoscyamine, and methscopolamine -drugs for breathing problems like ipratropium and tiotropium -drugs for certain stomach or intestine problems like propantheline, homatropine methylbromide, glycopyrrolate, atropine, belladonna, and dicyclomine -general anesthetics like etomidate, ketamine, nitrous oxide, propofol, desflurane, enflurane, halothane, isoflurane, and sevoflurane -medicines for depression, anxiety, or psychotic disturbances -medicines for sleep -muscle relaxants -naltrexone -narcotic medicines (opiates) for pain -phenothiazines like perphenazine, thioridazine, chlorpromazine, mesoridazine, fluphenazine, prochlorperazine, promazine, and  trifluoperazine -scopolamine -tramadol -trihexyphenidyl This list may not describe all possible interactions. Give your health care provider a list of all the medicines, herbs, non-prescription drugs, or dietary supplements you use. Also tell them if you smoke, drink alcohol, or use illegal drugs. Some items may interact with your medicine. What should I watch for while using this medicine? Tell your doctor or health care professional if your pain does not go away, if it gets worse, or if you have new or a different type of pain. You may develop tolerance to the medicine. Tolerance means that you will need a higher dose of the medication for pain relief. Tolerance is normal and is expected if you take this medicine for a long time. Do not suddenly stop taking your medicine because you may develop a severe reaction. Your body becomes used to the medicine. This does NOT mean you are addicted. Addiction is a behavior related to getting and using a drug for a non-medical reason. If you have pain, you have a medical reason to take pain medicine. Your doctor will tell you how much medicine to take. If your doctor wants you to stop the medicine, the dose will be  slowly lowered over time to avoid any side effects. You may get drowsy or dizzy. Do not drive, use machinery, or do anything that needs mental alertness until you know how this medicine affects you. Do not stand or sit up quickly, especially if you are an older patient. This reduces the risk of dizzy or fainting spells. Alcohol may interfere with the effect of this medicine. Avoid alcoholic drinks. There are different types of narcotic medicines (opiates) for pain. If you take more than one type at the same time, you may have more side effects. Give your health care provider a list of all medicines you use. Your doctor will tell you how much medicine to take. Do not take more medicine than directed. Call emergency for help if you have problems  breathing. The medicine will cause constipation. Try to have a bowel movement at least every 2 to 3 days. If you do not have a bowel movement for 3 days, call your doctor or health care professional. Do not take Tylenol (acetaminophen) or medicines that have acetaminophen with this medicine. Too much acetaminophen can be very dangerous. Many nonprescription medicines contain acetaminophen. Always read the labels carefully to avoid taking more acetaminophen. What side effects may I notice from receiving this medicine? Side effects that you should report to your doctor or health care professional as soon as possible: -allergic reactions like skin rash, itching or hives, swelling of the face, lips, or tongue -breathing difficulties, wheezing -confusion -light headedness or fainting spells -severe stomach pain -unusually weak or tired -yellowing of the skin or the whites of the eyes Side effects that usually do not require medical attention (report to your doctor or health care professional if they continue or are bothersome): -dizziness -drowsiness -nausea -vomiting This list may not describe all possible side effects. Call your doctor for medical advice about side effects. You may report side effects to FDA at 1-800-FDA-1088. Where should I keep my medicine? Keep out of the reach of children. This medicine can be abused. Keep your medicine in a safe place to protect it from theft. Do not share this medicine with anyone. Selling or giving away this medicine is dangerous and against the law. Store at room temperature between 20 and 25 degrees C (68 and 77 degrees F). Keep container tightly closed. Protect from light. This medicine may cause accidental overdose and death if it is taken by other adults, children, or pets. Flush any unused medicine down the toilet to reduce the chance of harm. Do not use the medicine after the expiration date. NOTE: This sheet is a summary. It may not cover all  possible information. If you have questions about this medicine, talk to your doctor, pharmacist, or health care provider.  2015, Elsevier/Gold Standard. (2013-03-26 13:17:35)

## 2014-11-20 NOTE — ED Notes (Signed)
Patient states that she has been itching her left eye since about 5 pm when she got shampoo into her eye. Eye is red and inflammed

## 2014-11-20 NOTE — ED Notes (Signed)
MD at bedside. 

## 2014-12-24 ENCOUNTER — Ambulatory Visit (INDEPENDENT_AMBULATORY_CARE_PROVIDER_SITE_OTHER): Payer: Managed Care, Other (non HMO)

## 2014-12-24 ENCOUNTER — Ambulatory Visit (INDEPENDENT_AMBULATORY_CARE_PROVIDER_SITE_OTHER): Payer: Managed Care, Other (non HMO) | Admitting: Internal Medicine

## 2014-12-24 VITALS — BP 120/82 | HR 94 | Temp 98.1°F | Resp 16 | Ht 64.0 in | Wt 193.0 lb

## 2014-12-24 DIAGNOSIS — J200 Acute bronchitis due to Mycoplasma pneumoniae: Secondary | ICD-10-CM

## 2014-12-24 DIAGNOSIS — J9801 Acute bronchospasm: Secondary | ICD-10-CM

## 2014-12-24 DIAGNOSIS — R059 Cough, unspecified: Secondary | ICD-10-CM

## 2014-12-24 DIAGNOSIS — R05 Cough: Secondary | ICD-10-CM

## 2014-12-24 DIAGNOSIS — T7840XA Allergy, unspecified, initial encounter: Secondary | ICD-10-CM

## 2014-12-24 LAB — POCT CBC
GRANULOCYTE PERCENT: 54.3 % (ref 37–80)
HCT, POC: 39.3 % (ref 37.7–47.9)
Hemoglobin: 12.1 g/dL — AB (ref 12.2–16.2)
Lymph, poc: 2.5 (ref 0.6–3.4)
MCH: 30 pg (ref 27–31.2)
MCHC: 30.9 g/dL — AB (ref 31.8–35.4)
MCV: 97.1 fL — AB (ref 80–97)
MID (cbc): 0.3 (ref 0–0.9)
MPV: 8.5 fL (ref 0–99.8)
PLATELET COUNT, POC: 209 10*3/uL (ref 142–424)
POC Granulocyte: 3.4 (ref 2–6.9)
POC LYMPH %: 41 % (ref 10–50)
POC MID %: 4.7 %M (ref 0–12)
RBC: 4.04 M/uL (ref 4.04–5.48)
RDW, POC: 15.8 %
WBC: 6.2 10*3/uL (ref 4.6–10.2)

## 2014-12-24 MED ORDER — ALBUTEROL SULFATE HFA 108 (90 BASE) MCG/ACT IN AERS: 2.0000 | INHALATION_SPRAY | Freq: Four times a day (QID) | RESPIRATORY_TRACT | Status: DC | PRN

## 2014-12-24 MED ORDER — AZITHROMYCIN 250 MG PO TABS: ORAL_TABLET | ORAL | Status: DC

## 2014-12-24 MED ORDER — HYDROCODONE-ACETAMINOPHEN 7.5-325 MG/15ML PO SOLN: 10.0000 mL | Freq: Four times a day (QID) | ORAL | Status: DC | PRN

## 2014-12-24 MED ORDER — CETIRIZINE HCL 10 MG PO TABS
10.0000 mg | ORAL_TABLET | Freq: Every day | ORAL | Status: DC
Start: 2014-12-24 — End: 2016-05-05

## 2014-12-24 NOTE — Patient Instructions (Signed)
Bronchospasm A bronchospasm is a spasm or tightening of the airways going into the lungs. During a bronchospasm breathing becomes more difficult because the airways get smaller. When this happens there can be coughing, a whistling sound when breathing (wheezing), and difficulty breathing. Bronchospasm is often associated with asthma, but not all patients who experience a bronchospasm have asthma. CAUSES  A bronchospasm is caused by inflammation or irritation of the airways. The inflammation or irritation may be triggered by:   Allergies (such as to animals, pollen, food, or mold). Allergens that cause bronchospasm may cause wheezing immediately after exposure or many hours later.   Infection. Viral infections are believed to be the most common cause of bronchospasm.   Exercise.   Irritants (such as pollution, cigarette smoke, strong odors, aerosol sprays, and paint fumes).   Weather changes. Winds increase molds and pollens in the air. Rain refreshes the air by washing irritants out. Cold air may cause inflammation.   Stress and emotional upset.  SIGNS AND SYMPTOMS   Wheezing.   Excessive nighttime coughing.   Frequent or severe coughing with a simple cold.   Chest tightness.   Shortness of breath.  DIAGNOSIS  Bronchospasm is usually diagnosed through a history and physical exam. Tests, such as chest X-rays, are sometimes done to look for other conditions. TREATMENT   Inhaled medicines can be given to open up your airways and help you breathe. The medicines can be given using either an inhaler or a nebulizer machine.  Corticosteroid medicines may be given for severe bronchospasm, usually when it is associated with asthma. HOME CARE INSTRUCTIONS   Always have a plan prepared for seeking medical care. Know when to call your health care provider and local emergency services (911 in the [place] Know where you can access local emergency care.  Only take medicines as  directed by your health care provider.  If you were prescribed an inhaler or nebulizer machine, ask your health care provider to explain how to use it correctly. Always use a spacer with your inhaler if you were given one.  It is necessary to remain calm during an attack. Try to relax and breathe more slowly.  Control your home environment in the following ways:   Change your heating and air conditioning filter at least once a month.   Limit your use of fireplaces and wood stoves.  Do not smoke and do not allow smoking in your home.   Avoid exposure to perfumes and fragrances.   Get rid of pests (such as roaches and mice) and their droppings.   Throw away plants if you see mold on them.   Keep your house clean and dust free.   Replace carpet with wood, tile, or vinyl flooring. Carpet can trap dander and dust.   Use allergy-proof pillows, mattress covers, and box spring covers.   Wash bed sheets and blankets every week in hot water and dry them in a dryer.   Use blankets that are made of polyester or cotton.   Wash hands frequently. SEEK MEDICAL CARE IF:   You have muscle aches.   You have chest pain.   The sputum changes from clear or white to yellow, green, gray, or bloody.   The sputum you cough up gets thicker.   There are problems that may be related to the medicine you are given, such as a rash, itching, swelling, or trouble breathing.  SEEK IMMEDIATE MEDICAL CARE IF:   You have worsening wheezing and coughing even   after taking your prescribed medicines.   You have increased difficulty breathing.   You develop severe chest pain. MAKE SURE YOU:   Understand these instructions.  Will watch your condition.  Will get help right away if you are not doing well or get worse. Document Released: 08/05/2003 Document Revised: 08/07/2013 Document Reviewed: 01/22/2013 [employer] Patient Information 2015 [employer]. This information is not  intended to replace advice given to you by your health care provider. Make sure you discuss any questions you have with your health care provider. Allergic Rhinitis Allergic rhinitis is when the mucous membranes in the nose respond to allergens. Allergens are particles in the air that cause your body to have an allergic reaction. This causes you to release allergic antibodies. Through a chain of events, these eventually cause you to release histamine into the blood stream. Although meant to protect the body, it is this release of histamine that causes your discomfort, such as frequent sneezing, congestion, and an itchy, runny nose.  CAUSES  Seasonal allergic rhinitis (hay fever) is caused by pollen allergens that may come from grasses, trees, and weeds. Year-round allergic rhinitis (perennial allergic rhinitis) is caused by allergens such as house dust mites, pet dander, and mold spores.  SYMPTOMS   Nasal stuffiness (congestion).  Itchy, runny nose with sneezing and tearing of the eyes. DIAGNOSIS  Your health care provider can help you determine the allergen or allergens that trigger your symptoms. If you and your health care provider are unable to determine the allergen, skin or blood testing may be used. TREATMENT  Allergic rhinitis does not have a cure, but it can be controlled by:  Medicines and allergy shots (immunotherapy).  Avoiding the allergen. Hay fever may often be treated with antihistamines in pill or nasal spray forms. Antihistamines block the effects of histamine. There are over-the-counter medicines that may help with nasal congestion and swelling around the eyes. Check with your health care provider before taking or giving this medicine.  If avoiding the allergen or the medicine prescribed do not work, there are many new medicines your health care provider can prescribe. Stronger medicine may be used if initial measures are ineffective. Desensitizing injections can be used if  medicine and avoidance does not work. Desensitization is when a patient is given ongoing shots until the body becomes less sensitive to the allergen. Make sure you follow up with your health care provider if problems continue. HOME CARE INSTRUCTIONS It is not possible to completely avoid allergens, but you can reduce your symptoms by taking steps to limit your exposure to them. It helps to know exactly what you are allergic to so that you can avoid your specific triggers. SEEK MEDICAL CARE IF:   You have a fever.  You develop a cough that does not stop easily (persistent).  You have shortness of breath.  You start wheezing.  Symptoms interfere with normal daily activities. Document Released: 04/27/2001 Document Revised: 08/07/2013 Document Reviewed: 04/09/2013 [employer] Patient Information 2015 [employer]. This information is not intended to replace advice given to you by your health care provider. Make sure you discuss any questions you have with your health care provider.  

## 2014-12-24 NOTE — Progress Notes (Signed)
   Subjective:    Patient ID: Jane Fletcher, female    DOB: 12/31/1975, 39 y.o.   MRN: [id number]  HPI2 week of coughing and congestion and chest ache with some shortness of breath.  Nasal congestion that is yellow from her sinus.  Daughter has been dx with CAP.  Patient has never smoked.   She does have a hx of bronchospasm and allergy in the past.   Review of Systems     Objective:   Physical Exam  Constitutional: She is oriented to person, place, and time. She appears well-developed and well-nourished. No distress.  HENT:  Head: Normocephalic.  Right Ear: External ear normal.  Left Ear: External ear normal.  Nose: Mucosal edema, rhinorrhea and sinus tenderness present. Right sinus exhibits maxillary sinus tenderness. Right sinus exhibits no frontal sinus tenderness. Left sinus exhibits maxillary sinus tenderness. Left sinus exhibits no frontal sinus tenderness.  Mouth/Throat: Oropharynx is clear and moist.  Eyes: EOM are normal. Pupils are equal, round, and reactive to light.  Neck: Normal range of motion. Neck supple.  Cardiovascular: Normal rate, regular rhythm and normal heart sounds.   Pulmonary/Chest: Effort normal. No tachypnea. No respiratory distress. She has no decreased breath sounds. She has wheezes. She has no rhonchi. She has no rales. She exhibits tenderness.  Musculoskeletal: Normal range of motion.  Lymphadenopathy:    She has no cervical adenopathy.  Neurological: She is alert and oriented to person, place, and time. She exhibits normal muscle tone. Coordination normal.  Psychiatric: She has a normal mood and affect. Her behavior is normal. Thought content normal.  Vitals reviewed.   Peak flow rate 415 with small amount of wheezing.  [facility] reading (PRIMARY) by  Dr gUEST no infiltrate  Results for orders placed or performed in visit on 12/24/14  POCT CBC  Result Value Ref Range   WBC 6.2 4.6 - 10.2 K/uL   Lymph, poc 2.5 0.6 - 3.4   POC LYMPH PERCENT  41.0 10 - 50 %L   MID (cbc) 0.3 0 - 0.9   POC MID % 4.7 0 - 12 %M   POC Granulocyte 3.4 2 - 6.9   Granulocyte percent 54.3 37 - 80 %G   RBC 4.04 4.04 - 5.48 M/uL   Hemoglobin 12.1 (A) 12.2 - 16.2 g/dL   HCT, POC 36.639.3 44.037.7 - 47.9 %   MCV 97.1 (A) 80 - 97 fL   MCH, POC 30.0 27 - 31.2 pg   MCHC 30.9 (A) 31.8 - 35.4 g/dL   RDW, POC 34.715.8 %   Platelet Count, POC 209 142 - 424 K/uL   MPV 8.5 0 - 99.8 fL          Assessment & Plan:  Albuterol HFA/Cetirizine Bronchitis/Zpak and Lortab elixir

## 2014-12-30 ENCOUNTER — Ambulatory Visit (INDEPENDENT_AMBULATORY_CARE_PROVIDER_SITE_OTHER): Payer: Managed Care, Other (non HMO) | Admitting: Family Medicine

## 2014-12-30 ENCOUNTER — Encounter: Payer: Self-pay | Admitting: Family Medicine

## 2014-12-30 VITALS — BP 114/77 | HR 91 | Temp 98.9°F | Resp 16 | Ht 64.0 in | Wt 194.0 lb

## 2014-12-30 DIAGNOSIS — M797 Fibromyalgia: Secondary | ICD-10-CM | POA: Diagnosis not present

## 2014-12-30 DIAGNOSIS — E669 Obesity, unspecified: Secondary | ICD-10-CM | POA: Diagnosis not present

## 2014-12-30 DIAGNOSIS — J208 Acute bronchitis due to other specified organisms: Secondary | ICD-10-CM

## 2014-12-30 MED ORDER — CYCLOBENZAPRINE HCL 10 MG PO TABS: 10.0000 mg | ORAL_TABLET | Freq: Every day | ORAL | Status: DC

## 2014-12-30 NOTE — Patient Instructions (Signed)
Your lungs sound fine.  Continue to use the albuterol just as needed and let me know if you are not progressing You can use the flexeril at night as needed- however remember this is a sedating medication I do think that exercise and weight loss with help with your fibromyalgia pain and energy level Work towards exercising most days of the week, with a combination of cardio and weight training If you can fit in into your schedule, taking classes at a gym or walking with a friend are great motivators!  Wt Readings from Last 3 Encounters:  12/30/14 194 lb (87.998 kg)  12/24/14 193 lb (87.544 kg)  11/20/14 183 lb (83.008 kg)   Aim towards losing 1 lb a week, and let's plan to recheck in 3 months to check your progress.    Take care!

## 2014-12-30 NOTE — Progress Notes (Signed)
[facility] [address], [place] [id number] [phone]  Date:  12/30/2014   Name:  Jane Fletcher   DOB:  03/03/1976   MRN:  [id number]  PCP:  Carlyle LipaGAITHER,MICHALE D, MD    Chief Complaint: Establish Care; Fibromyalgia; and Wheezing   History of Present Illness:  Jane Fletcher is a 39 y.o. very pleasant female patient who presents with the following:  I have seen this pt in 2013 with conjunctivitis. Seen here 6 days ago with a cough- negative CXR, treated with a zpack. She states that she is "having some issues with my fibromyalgia again."  She has tried lyrica and also gabapentin in the past but did not feel that this was very helpful.  Flexeril at bedtime has helped her in the past.  She is also taking some aleve during the day which does help her some.    She does feel that her chest is getting better.    She has finished the zpack, and uses albuterol as needed. The cough is better, less frequent.  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  She is married and had a 37 year old daughter- rising kindergartener  She notes her main joint pains are in her shoulders, back and knees  She does not exercise due to "I'm so tired."   There are no active problems to display for this patient.   Past Medical History  Diagnosis Date  . Fibromyalgia   . Allergy   . Anxiety     Past Surgical History  Procedure Laterality Date  . Cholecystectomy      History  Substance Use Topics  . Smoking status: Never Smoker   . Smokeless tobacco: Never Used  . Alcohol Use: No    Family History  Problem Relation Age of Onset  . Hypertension Mother   . Mental illness Sister   . Migraines Sister     Allergies  Allergen Reactions  . Penicillins Hives    Medication list has been reviewed and updated.  Current Outpatient Prescriptions on File Prior to Visit  Medication Sig Dispense Refill  .  albuterol (PROVENTIL HFA;VENTOLIN HFA) 108 (90 BASE) MCG/ACT inhaler Inhale 2 puffs into the lungs every 6 (six) hours as needed for wheezing or shortness of breath. 1 Inhaler 3  . cetirizine (ZYRTEC) 10 MG tablet Take 1 tablet (10 mg total) by mouth daily. 30 tablet 11  . HYDROcodone-acetaminophen (HYCET) 7.5-325 mg/15 ml solution Take 10 mLs by mouth every 6 (six) hours as needed. (Patient not taking: Reported on 12/30/2014) 120 mL 0  . naproxen sodium (ANAPROX) 220 MG tablet Take 220 mg by mouth 2 (two) times daily with a meal.     No current facility-administered medications on file prior to visit.    Review of Systems:  As per HPI- otherwise negative.   Physical Examination: Filed Vitals:   12/30/14 1052  BP: 114/77  Pulse: 91  Temp: 98.9 F (37.2 C)  Resp: 16   Filed Vitals:   12/30/14 1052  Height: 5\' 4"  (1.626 m)  Weight: 194 lb (87.998 kg)   Body mass index is 33.28 kg/(m^2). Ideal Body Weight: Weight in (lb) to have BMI = 25: 145.3  GEN: WDWN, NAD, Non-toxic, A & O x 3, obese, looks well HEENT: Atraumatic, Normocephalic. Neck supple. No masses, No LAD. Ears and Nose: No external deformity. CV: RRR, No  M/G/R. No JVD. No thrill. No extra heart sounds. PULM: CTA B, no wheezes, crackles, rhonchi. No retractions. No resp. distress. No accessory muscle use. EXTR: No c/c/e NEURO Normal gait.  PSYCH: Normally interactive. Conversant. Not depressed or anxious appearing.  Calm demeanor.  She notes tenderness with palpation of her shoulder and upper back  Assessment and Plan: Fibromyalgia - Plan: cyclobenzaprine (FLEXERIL) 10 MG tablet  Acute bronchitis due to other specified organisms  Obesity  Her respiratory sx are improving- she will report if not continuing to get better Did fill flexeril for her to use at night Encouraged her to exercise for her FBM and also for weight control  Signed Abbe AmsterdamJessica Yenni Carra, MD

## 2015-03-19 ENCOUNTER — Ambulatory Visit (INDEPENDENT_AMBULATORY_CARE_PROVIDER_SITE_OTHER): Payer: Managed Care, Other (non HMO) | Admitting: Physician Assistant

## 2015-03-19 ENCOUNTER — Ambulatory Visit (HOSPITAL_COMMUNITY)
Admission: RE | Admit: 2015-03-19 | Discharge: 2015-03-19 | Disposition: A | Discharge status: Home / Self Care | Payer: Managed Care, Other (non HMO) | Source: Ambulatory Visit | Attending: Physician Assistant | Admitting: Physician Assistant

## 2015-03-19 VITALS — BP 124/88 | HR 98 | Temp 99.1°F | Resp 18 | Ht 63.5 in | Wt 199.0 lb

## 2015-03-19 DIAGNOSIS — M79662 Pain in left lower leg: Secondary | ICD-10-CM

## 2015-03-19 DIAGNOSIS — M79605 Pain in left leg: Secondary | ICD-10-CM | POA: Diagnosis present

## 2015-03-19 NOTE — Progress Notes (Signed)
[facility] [address], [place] [address] [phone]  Date:  03/19/2015   Name:  Cadence Minton   DOB:  [birth year]   MRN:  [id number]  PCP:  Abbe Amsterdam, MD    History of Present Illness:  Deshawna Mcneece is a 39 y.o. female patient who presents to [facility] for concern of a lower extremity blood clot in her left leg. Patient started to have pain in left calf 8 days ago where it felt like a bruise and sensitive with walking and weight. She became concerned last night when the pain started to shoot up her leg. There was no trauma or injury. Patient was sitting on a train or 12 hours prior to the calf pain. She denies any shortness of breath or dyspnea. She has no history of malignancy and she does not smoke. Patient takes Aleve for her fibromyalgia every day. She has not tried any [employer] at this time. She has no personal nor familial history of clotting disorders. No history of DVTs or PEs. Third cousin had PEs.     Patient Active Problem List   Diagnosis Date Noted  . Fibromyalgia 12/30/2014  . Obesity 12/30/2014    Past Medical History  Diagnosis Date  . Fibromyalgia   . Allergy   . Anxiety     Past Surgical History  Procedure Laterality Date  . Cholecystectomy      History  Substance Use Topics  . Smoking status: Never Smoker   . Smokeless tobacco: Never Used  . Alcohol Use: No    Family History  Problem Relation Age of Onset  . Hypertension Mother   . Mental illness Sister   . Migraines Sister     Allergies  Allergen Reactions  . Penicillins Hives    Medication list has been reviewed and updated.  Current Outpatient Prescriptions on File Prior to Visit  Medication Sig Dispense Refill  . cetirizine (ZYRTEC) 10 MG tablet Take 1 tablet (10 mg total) by mouth daily. 30 tablet 11  . cyclobenzaprine (FLEXERIL) 10 MG tablet Take 1 tablet (10 mg total) by mouth at bedtime. Use as needed for muscle pain 30 tablet 1   . albuterol (PROVENTIL HFA;VENTOLIN HFA) 108 (90 BASE) MCG/ACT inhaler Inhale 2 puffs into the lungs every 6 (six) hours as needed for wheezing or shortness of breath. (Patient not taking: Reported on 03/19/2015) 1 Inhaler 3  . naproxen sodium (ANAPROX) 220 MG tablet Take 220 mg by mouth 2 (two) times daily with a meal.     No current facility-administered medications on file prior to visit.    ROS ROS unremarkable unless listed above.  Physical Examination: BP 124/88 mmHg  Pulse 98  Temp(Src) 99.1 F (37.3 C) (Oral)  Resp 18  Ht 5' 3.5" (1.613 m)  Wt 199 lb (90.266 kg)  BMI 34.69 kg/m2  SpO2 98%  LMP 03/05/2015 Ideal Body Weight: Weight in (lb) to have BMI = 25: 143.1  Physical Exam Cooperative and oriented 4 in no acute distress. Breath sounds are normal without wheezing or rhonchi. Regular rate and rhythm without murmurs, gallops, or rubs. Left leg with small hematoma along medial side of gastrocnemius. There is tenderness upon palpation as well as throughout the calf. Range of motion slightly decreased in dorsiflexion secondary to pain. Size is 37 cm bilaterally.  Normal plantar flexion.  Normal distal pulses with good capillary refill.  Assessment and Plan: 39 year old female with past medical history listed above is here today  for concern of a blood clot. This appears to be a contusion, however the entire cath is tender. And patient is concerned at this time. I am scheduling a stat appointment for an ultrasound: Confirmed that this is not thrombosis.  I have advised her that if if it is negative we will ice the calf several times a day and watch for relief. It is likely that during this train ride there was some unknown turbulence.  1. Calf pain, left - VAS [place] LOWER EXTREMITY VENOUS (DVT); Future   Trena Platt, PA-C [facility] 03/19/2015 9:27 PM

## 2015-03-19 NOTE — Progress Notes (Signed)
VASCULAR LAB PRELIMINARY  PRELIMINARY  PRELIMINARY  PRELIMINARY  Left lower extremity venous duplex completed.    Preliminary report:  Left:  No evidence of DVT, superficial thrombosis, or Baker's cyst.  Fawzi Melman, RVS 03/19/2015, 3:17 PM

## 2015-03-19 NOTE — Patient Instructions (Addendum)
A doppler is scheduled today at [facility] at the Vascular Lab. The appt is at 2:00pm. Please do not check into the Emergency room, just register at Admitting.  I will have the results, and will contact you following [place].

## 2015-03-20 NOTE — Progress Notes (Signed)
When were they going to do the venous ultrasound ??

## 2015-03-21 ENCOUNTER — Telehealth: Payer: Self-pay | Admitting: Physician Assistant

## 2015-03-21 NOTE — Progress Notes (Addendum)
Patient had ultrasound at 2:00pm (same day of visit) at Vascular Lab which presented normal without evidence of DVT, superficial thrombosis, or Baker's cyst.  She was advised to ice 3-4 times per day, along with anti-inflammatory.  I have left message on her phone to contact, if this does not resolve in the next few days, as further labs may be necessary.

## 2015-05-28 ENCOUNTER — Emergency Department (HOSPITAL_BASED_OUTPATIENT_CLINIC_OR_DEPARTMENT_OTHER)
Admission: EM | Admit: 2015-05-28 | Discharge: 2015-05-28 | Disposition: A | Discharge status: Home / Self Care | Payer: Managed Care, Other (non HMO) | Attending: Emergency Medicine | Admitting: Emergency Medicine

## 2015-05-28 ENCOUNTER — Encounter (HOSPITAL_BASED_OUTPATIENT_CLINIC_OR_DEPARTMENT_OTHER): Payer: Self-pay | Admitting: Emergency Medicine

## 2015-05-28 DIAGNOSIS — Z791 Long term (current) use of non-steroidal anti-inflammatories (NSAID): Secondary | ICD-10-CM | POA: Insufficient documentation

## 2015-05-28 DIAGNOSIS — Z79899 Other long term (current) drug therapy: Secondary | ICD-10-CM | POA: Insufficient documentation

## 2015-05-28 DIAGNOSIS — H109 Unspecified conjunctivitis: Secondary | ICD-10-CM

## 2015-05-28 DIAGNOSIS — Z880 Allergy status to penicillin: Secondary | ICD-10-CM | POA: Insufficient documentation

## 2015-05-28 DIAGNOSIS — Z8659 Personal history of other mental and behavioral disorders: Secondary | ICD-10-CM | POA: Insufficient documentation

## 2015-05-28 DIAGNOSIS — M797 Fibromyalgia: Secondary | ICD-10-CM | POA: Insufficient documentation

## 2015-05-28 MED ORDER — ERYTHROMYCIN 5 MG/GM OP OINT: TOPICAL_OINTMENT | OPHTHALMIC | Status: DC

## 2015-05-28 NOTE — ED Notes (Signed)
Patient states that she started to have itching and swelling to her left eye starting last night

## 2015-05-28 NOTE — ED Notes (Signed)
Visual acuity performed with no problems.

## 2015-05-28 NOTE — ED Provider Notes (Signed)
CSN: [id number]     Arrival date & time 05/28/15  1330 History   First MD Initiated Contact with Patient 05/28/15 1527     Chief Complaint  Patient presents with  . Eye Drainage     (Consider location/radiation/quality/duration/timing/severity/associated sxs/prior Treatment) The history is provided by the patient.  Jane Fletcher is a 39 y.o. female history of fibromyalgia here presenting with left eye drainage and redness. Symptoms since yesterday. States that she was driving home or work and she feels like the left eye was irritated. Noticed some swelling and redness in the eye today. States that she has been having some encrustation as well as some clear drainage. Denies any fevers or chills. Denies any sinus congestion or sore seasonal allergies. She does not currently wear contacts.     Past Medical History  Diagnosis Date  . Fibromyalgia   . Allergy   . Anxiety    Past Surgical History  Procedure Laterality Date  . Cholecystectomy     Family History  Problem Relation Age of Onset  . Hypertension Mother   . Mental illness Sister   . Migraines Sister    Social History  Substance Use Topics  . Smoking status: Never Smoker   . Smokeless tobacco: Never Used  . Alcohol Use: No   OB History    No data available     Review of Systems  Eyes: Positive for discharge and redness.  All other systems reviewed and are negative.     Allergies  Penicillins  Home Medications   Prior to Admission medications   Medication Sig Start Date End Date Taking? Authorizing Provider  albuterol (PROVENTIL HFA;VENTOLIN HFA) 108 (90 BASE) MCG/ACT inhaler Inhale 2 puffs into the lungs every 6 (six) hours as needed for wheezing or shortness of breath. Patient not taking: Reported on 03/19/2015 12/24/14   Jonita Albee, MD  cetirizine (ZYRTEC) 10 MG tablet Take 1 tablet (10 mg total) by mouth daily. 12/24/14   Jonita Albee, MD  cyclobenzaprine (FLEXERIL) 10 MG tablet Take 1 tablet  (10 mg total) by mouth at bedtime. Use as needed for muscle pain 12/30/14   Gwenlyn Found Copland, MD  naproxen sodium (ANAPROX) 220 MG tablet Take 220 mg by mouth 2 (two) times daily with a meal.    Historical Provider, MD   BP 147/83 mmHg  Pulse 75  Temp(Src) 98.9 F (37.2 C) (Oral)  Resp 16  Ht  (1.6 m)  Wt 190 lb (86.183 kg)  BMI 33.67 kg/m2  SpO2 97%  LMP 05/27/2015 Physical Exam  Constitutional: She appears well-developed and well-nourished.  HENT:  Head: Normocephalic.  Mouth/Throat: Oropharynx is clear and moist.  Eyes: Pupils are equal, round, and reactive to light.  L conjunctiva slightly red. No obvious purulent discharge. No obvious foreign body.   Neck: Normal range of motion. Neck supple.  Cardiovascular: Normal rate.   Pulmonary/Chest: Effort normal.  Abdominal: Soft.  Musculoskeletal: Normal range of motion.  Neurological: She is alert.  Skin: Skin is warm.  Psychiatric: She has a normal mood and affect. Her behavior is normal. Judgment and thought content normal.  Nursing note and vitals reviewed.   ED Course  Procedures (including critical care time) Labs Review Labs Reviewed - No data to display  Imaging Review No results found. I have personally reviewed and evaluated these images and lab results as part of my medical decision-making.   EKG Interpretation None      MDM  Final diagnoses:  None   Jane Fletcher is a 39 y.o. female here with L eye conjunctivitis. Likely viral vs early bacterial. Vitals stable. Vision nl as per nursing. Will dc home with erythromycin ointment.      Richardean Canalavid H Yao, MD 05/28/15 [phone]

## 2015-05-28 NOTE — Discharge Instructions (Signed)
Use erythromycin ointment twice daily for a week.  Wash hands frequently.   See your doctor.   Return to ER if you have worse redness, purulent discharge from the eye, fever, blurry vision.

## 2015-08-31 ENCOUNTER — Encounter (HOSPITAL_BASED_OUTPATIENT_CLINIC_OR_DEPARTMENT_OTHER): Payer: Self-pay | Admitting: Emergency Medicine

## 2015-08-31 ENCOUNTER — Emergency Department (HOSPITAL_BASED_OUTPATIENT_CLINIC_OR_DEPARTMENT_OTHER)
Admission: EM | Admit: 2015-08-31 | Discharge: 2015-08-31 | Disposition: A | Discharge status: Home / Self Care | Payer: 59 | Attending: Emergency Medicine | Admitting: Emergency Medicine

## 2015-08-31 DIAGNOSIS — Z8659 Personal history of other mental and behavioral disorders: Secondary | ICD-10-CM | POA: Diagnosis not present

## 2015-08-31 DIAGNOSIS — Z791 Long term (current) use of non-steroidal anti-inflammatories (NSAID): Secondary | ICD-10-CM | POA: Insufficient documentation

## 2015-08-31 DIAGNOSIS — Z880 Allergy status to penicillin: Secondary | ICD-10-CM | POA: Diagnosis not present

## 2015-08-31 DIAGNOSIS — R000 Tachycardia, unspecified: Secondary | ICD-10-CM | POA: Insufficient documentation

## 2015-08-31 DIAGNOSIS — R197 Diarrhea, unspecified: Secondary | ICD-10-CM | POA: Insufficient documentation

## 2015-08-31 DIAGNOSIS — M797 Fibromyalgia: Secondary | ICD-10-CM | POA: Insufficient documentation

## 2015-08-31 DIAGNOSIS — Z3202 Encounter for pregnancy test, result negative: Secondary | ICD-10-CM | POA: Insufficient documentation

## 2015-08-31 DIAGNOSIS — Z79899 Other long term (current) drug therapy: Secondary | ICD-10-CM | POA: Insufficient documentation

## 2015-08-31 DIAGNOSIS — R1110 Vomiting, unspecified: Secondary | ICD-10-CM | POA: Diagnosis present

## 2015-08-31 DIAGNOSIS — R112 Nausea with vomiting, unspecified: Secondary | ICD-10-CM | POA: Diagnosis not present

## 2015-08-31 LAB — COMPREHENSIVE METABOLIC PANEL
ALBUMIN: 3.8 g/dL (ref 3.5–5.0)
ALT: 14 U/L (ref 14–54)
AST: 22 U/L (ref 15–41)
Alkaline Phosphatase: 50 U/L (ref 38–126)
Anion gap: 9 (ref 5–15)
BUN: 15 mg/dL (ref 6–20)
CHLORIDE: 107 mmol/L (ref 101–111)
CO2: 24 mmol/L (ref 22–32)
Calcium: 8.9 mg/dL (ref 8.9–10.3)
Creatinine, Ser: 0.6 mg/dL (ref 0.44–1.00)
GFR calc Af Amer: >60 mL/min (ref >60)
Glucose, Bld: 115 mg/dL — ABNORMAL HIGH (ref 65–99)
POTASSIUM: 3.9 mmol/L (ref 3.5–5.1)
SODIUM: 140 mmol/L (ref 135–145)
Total Bilirubin: 0.5 mg/dL (ref 0.3–1.2)
Total Protein: 7.9 g/dL (ref 6.5–8.1)

## 2015-08-31 LAB — URINALYSIS, ROUTINE W REFLEX MICROSCOPIC
Bilirubin Urine: NEGATIVE
GLUCOSE, UA: NEGATIVE mg/dL
Hgb urine dipstick: NEGATIVE
KETONES UR: NEGATIVE mg/dL
Leukocytes, UA: NEGATIVE
Nitrite: NEGATIVE
PH: 6.5 (ref 5.0–8.0)
Protein, ur: 30 mg/dL — AB
Specific Gravity, Urine: 1.021 (ref 1.005–1.030)

## 2015-08-31 LAB — CBC
HEMATOCRIT: 36.7 % (ref 36.0–46.0)
Hemoglobin: 12.2 g/dL (ref 12.0–15.0)
MCH: 31.1 pg (ref 26.0–34.0)
MCHC: 33.2 g/dL (ref 30.0–36.0)
MCV: 93.6 fL (ref 78.0–100.0)
Platelets: 225 10*3/uL (ref 150–400)
RBC: 3.92 MIL/uL (ref 3.87–5.11)
RDW: 11.8 % (ref 11.5–15.5)
WBC: 6.8 10*3/uL (ref 4.0–10.5)

## 2015-08-31 LAB — PREGNANCY, URINE: Preg Test, Ur: NEGATIVE

## 2015-08-31 LAB — URINE MICROSCOPIC-ADD ON: RBC / HPF: NONE SEEN RBC/hpf (ref 0–5)

## 2015-08-31 LAB — LIPASE, BLOOD: LIPASE: 21 U/L (ref 11–51)

## 2015-08-31 MED ORDER — SODIUM CHLORIDE 0.9 % IV BOLUS (SEPSIS)
1000.0000 mL | Freq: Once | INTRAVENOUS | Status: AC
  Administered 2015-08-31: 1000 mL via INTRAVENOUS

## 2015-08-31 MED ORDER — ONDANSETRON HCL 4 MG PO TABS: 4.0000 mg | ORAL_TABLET | Freq: Four times a day (QID) | ORAL | Status: DC

## 2015-08-31 MED ORDER — ONDANSETRON HCL 4 MG/2ML IJ SOLN
4.0000 mg | Freq: Once | INTRAMUSCULAR | Status: AC
  Administered 2015-08-31: 4 mg via INTRAVENOUS
  Filled 2015-08-31: qty 2

## 2015-08-31 NOTE — ED Notes (Signed)
Patient tolerated fluids without nausea or vomiting.

## 2015-08-31 NOTE — ED Provider Notes (Addendum)
CSN: [id number]     Arrival date & time 08/31/15  1834 History  By signing my name below, I, Jane Fletcher, attest that this documentation has been prepared under the direction and in the presence of Jane OctaveStephen Kaelynne Christley, MD. Electronically Signed: Placido SouLogan Fletcher, ED Scribe. 08/31/2015. 7:26 PM.     Chief Complaint  Patient presents with  . Emesis    The history is provided by the patient. No language interpreter was used.   HPI Comments: Jane Fletcher is a 40 y.o. female who presents to the Emergency Department complaining of n/v with onset last night at 1:00 AM and her most recent occurrence at 4:00 PM. She notes eating pizza prior to her symptoms which was left out after her family ate it for ~2 hours with hamburger on it prior to her consuming it. She notes associated, mild, 2x diarrhea. Pt denies any recent abx or travel. Pt notes a hx of fibromyalgia further noting she took Aleve and a muscle relaxer earlier today. Her LNMP was 1/6. Pt confirms her SHx. She denies bloody stool, hematemesis, fevers, chills, dysuria, abd pain or other associated symptoms at this time.    Past Medical History  Diagnosis Date  . Fibromyalgia   . Allergy   . Anxiety    Past Surgical History  Procedure Laterality Date  . Cholecystectomy     Family History  Problem Relation Age of Onset  . Hypertension Mother   . Mental illness Sister   . Migraines Sister    Social History  Substance Use Topics  . Smoking status: Never Smoker   . Smokeless tobacco: Never Used  . Alcohol Use: No   OB History    No data available     Review of Systems A complete 10 system review of systems was obtained and all systems are negative except as noted in the HPI and PMH.   Allergies  Penicillins  Home Medications   Prior to Admission medications   Medication Sig Start Date End Date Taking? Authorizing Provider  albuterol (PROVENTIL HFA;VENTOLIN HFA) 108 (90 BASE) MCG/ACT inhaler Inhale 2 puffs into the  lungs every 6 (six) hours as needed for wheezing or shortness of breath. Patient not taking: Reported on 03/19/2015 12/24/14   Jonita Albeehris W Guest, MD  cetirizine (ZYRTEC) 10 MG tablet Take 1 tablet (10 mg total) by mouth daily. 12/24/14   Jonita Albeehris W Guest, MD  cyclobenzaprine (FLEXERIL) 10 MG tablet Take 1 tablet (10 mg total) by mouth at bedtime. Use as needed for muscle pain 12/30/14   Pearline CablesJessica C Copland, MD  erythromycin ophthalmic ointment Place a 1/2 inch ribbon of ointment into the lower eyelid BID for a week 05/28/15   Richardean Canalavid H Yao, MD  naproxen sodium (ANAPROX) 220 MG tablet Take 220 mg by mouth 2 (two) times daily with a meal.    Historical Provider, MD  ondansetron (ZOFRAN) 4 MG tablet Take 1 tablet (4 mg total) by mouth every 6 (six) hours. 08/31/15   Jane OctaveStephen Waylyn Tenbrink, MD   BP 121/87 mmHg  Pulse 102  Temp(Src) 98.9 F (37.2 C) (Oral)  Resp 20  Ht 5\' 3"  (1.6 m)  Wt 190 lb (86.183 kg)  BMI 33.67 kg/m2  SpO2 98%  LMP 08/22/2015    Physical Exam  Constitutional: She is oriented to person, place, and time. She appears well-developed and well-nourished. No distress.  HENT:  Head: Normocephalic and atraumatic.  Mouth/Throat: Mucous membranes are dry. No oropharyngeal exudate.  Eyes: Conjunctivae and EOM  are normal. Pupils are equal, round, and reactive to light.  Neck: Normal range of motion. Neck supple.  No meningismus.  Cardiovascular: Regular rhythm, normal heart sounds and intact distal pulses.  Tachycardia present.   No murmur heard. Pulmonary/Chest: Effort normal and breath sounds normal. No respiratory distress.  Abdominal: Soft. There is no tenderness. There is no rebound and no guarding.  Musculoskeletal: Normal range of motion. She exhibits no edema or tenderness.  Neurological: She is alert and oriented to person, place, and time. No cranial nerve deficit. She exhibits normal muscle tone. Coordination normal.  No ataxia on finger to nose bilaterally. No pronator drift. 5/5 strength  throughout. CN 2-12 intact.Equal grip strength. Sensation intact.   Skin: Skin is warm.  Psychiatric: She has a normal mood and affect. Her behavior is normal.  Nursing note and vitals reviewed.   ED Course  Procedures  DIAGNOSTIC STUDIES: Oxygen Saturation is 100% on RA, normal by my interpretation.    COORDINATION OF CARE: 7:25 PM Discussed next steps with pt and she agreed to the plan.    Labs Review Labs Reviewed  COMPREHENSIVE METABOLIC PANEL - Abnormal; Notable for the following:    Glucose, Bld 115 (*)    All other components within normal limits  URINALYSIS, ROUTINE W REFLEX MICROSCOPIC (NOT AT [facility]) - Abnormal; Notable for the following:    APPearance CLOUDY (*)    Protein, ur 30 (*)    All other components within normal limits  URINE MICROSCOPIC-ADD ON - Abnormal; Notable for the following:    Squamous Epithelial / LPF 6-30 (*)    Bacteria, UA MANY (*)    All other components within normal limits  LIPASE, BLOOD  CBC  PREGNANCY, URINE    Imaging Review No results found. I have personally reviewed and evaluated these images and lab results as part of my medical decision-making.   EKG Interpretation None      MDM   Final diagnoses:  Nausea vomiting and diarrhea    Nausea vomiting and diarrhea since last night. No sick contacts. No recent travel or antibiotic use.  Dry mucous membranes with tachycardia. Abdomen soft and nontender.  IV fluids, labs, antibiotics.  Labs are reassuring. UA is contaminated. Patient is given by mouth and IV fluids. Heart rate has improved to 98. She is tolerating by mouth. No vomiting in the ED. Abdomen is soft and nontender.  Suspect likely viral gastroenteritis. By mouth hydration at home with antiemetics. Return precautions discussed.  I personally performed the services described in this documentation, which was scribed in my presence. The recorded information has been reviewed and is accurate.     Jane Octave,  MD 08/31/15 9147  Jane Octave, MD 09/01/15 [phone]

## 2015-08-31 NOTE — Discharge Instructions (Signed)

## 2015-08-31 NOTE — ED Notes (Signed)
Patient states that she has been throwing up since 1 am  - reports denies any diarrhea

## 2015-08-31 NOTE — ED Notes (Signed)
Pt given PO challenge with ginger ale per request. States no nausea and she feels much better.

## 2015-09-05 ENCOUNTER — Encounter: Payer: Self-pay | Admitting: Family Medicine

## 2015-09-10 ENCOUNTER — Encounter: Payer: Self-pay | Admitting: Family Medicine

## 2015-10-30 ENCOUNTER — Ambulatory Visit (INDEPENDENT_AMBULATORY_CARE_PROVIDER_SITE_OTHER): Payer: 59 | Admitting: Family Medicine

## 2015-10-30 ENCOUNTER — Encounter: Payer: Self-pay | Admitting: Family Medicine

## 2015-10-30 VITALS — BP 118/90 | HR 92 | Temp 98.5°F | Wt 209.6 lb

## 2015-10-30 DIAGNOSIS — J9801 Acute bronchospasm: Secondary | ICD-10-CM | POA: Diagnosis not present

## 2015-10-30 DIAGNOSIS — R059 Cough, unspecified: Secondary | ICD-10-CM

## 2015-10-30 DIAGNOSIS — R05 Cough: Secondary | ICD-10-CM | POA: Diagnosis not present

## 2015-10-30 DIAGNOSIS — J208 Acute bronchitis due to other specified organisms: Secondary | ICD-10-CM

## 2015-10-30 DIAGNOSIS — R5381 Other malaise: Secondary | ICD-10-CM

## 2015-10-30 DIAGNOSIS — T7840XA Allergy, unspecified, initial encounter: Secondary | ICD-10-CM | POA: Diagnosis not present

## 2015-10-30 DIAGNOSIS — M797 Fibromyalgia: Secondary | ICD-10-CM

## 2015-10-30 MED ORDER — ALBUTEROL SULFATE (2.5 MG/3ML) 0.083% IN NEBU
2.5000 mg | INHALATION_SOLUTION | Freq: Once | RESPIRATORY_TRACT | Status: AC
  Administered 2015-10-30: 2.5 mg via RESPIRATORY_TRACT

## 2015-10-30 MED ORDER — AZITHROMYCIN 250 MG PO TABS: ORAL_TABLET | ORAL | Status: DC

## 2015-10-30 MED ORDER — CYCLOBENZAPRINE HCL 10 MG PO TABS: 10.0000 mg | ORAL_TABLET | Freq: Every day | ORAL | Status: DC

## 2015-10-30 MED ORDER — ALBUTEROL SULFATE HFA 108 (90 BASE) MCG/ACT IN AERS: 2.0000 | INHALATION_SPRAY | Freq: Four times a day (QID) | RESPIRATORY_TRACT | Status: DC | PRN

## 2015-10-30 NOTE — Progress Notes (Signed)
Pre visit review using our clinic review tool, if applicable. No additional management support is needed unless otherwise documented below in the visit note. 

## 2015-10-30 NOTE — Progress Notes (Signed)
[facility] [address], [address] [place], [place] [address] [phone]  Date:  10/30/2015   Name:  Jane Fletcher   DOB:  [birth year]   MRN:  [id number]  PCP:  Abbe Amsterdam, MD    Chief Complaint: Sinus Problem   History of Present Illness:  Jane Fletcher is a 40 y.o. very pleasant female patient who presents with the following:  Healthy except for obesity and fibromyalgia.  Here today with complaint of illness.  She has noted some sinus issues since October of last year.  Over the last 3 days she started to feel worse- she had sinus HA, cough, chest congestion, chest tightness- she is using her inhaler some which helps, last used yesterday   She has not noted a fever She has noted body aches, fatigue and chills No nausea or vomiting- she does have poor appetite  LMP earlier this month She is on albuterol, zyrtec.  She has an inhaler on hand but does consider herself to have asthma- she just wheezes with illness.   She uses daily aleve for her FBM- last used aleve and her inhaler yesterday Her daughter had an ear infection recently   Patient Active Problem List   Diagnosis Date Noted  . Fibromyalgia 12/30/2014  . Obesity 12/30/2014    Past Medical History  Diagnosis Date  . Fibromyalgia   . Allergy   . Anxiety     Past Surgical History  Procedure Laterality Date  . Cholecystectomy      Social History  Substance Use Topics  . Smoking status: Never Smoker   . Smokeless tobacco: Never Used  . Alcohol Use: No    Family History  Problem Relation Age of Onset  . Hypertension Mother   . Mental illness Sister   . Migraines Sister     Allergies  Allergen Reactions  . Penicillins Hives    Medication list has been reviewed and updated.  Current Outpatient Prescriptions on File Prior to Visit  Medication Sig Dispense Refill  . albuterol (PROVENTIL HFA;VENTOLIN HFA) 108 (90 BASE) MCG/ACT  inhaler Inhale 2 puffs into the lungs every 6 (six) hours as needed for wheezing or shortness of breath. 1 Inhaler 3  . cetirizine (ZYRTEC) 10 MG tablet Take 1 tablet (10 mg total) by mouth daily. 30 tablet 11  . cyclobenzaprine (FLEXERIL) 10 MG tablet Take 1 tablet (10 mg total) by mouth at bedtime. Use as needed for muscle pain 30 tablet 1  . naproxen sodium (ANAPROX) 220 MG tablet Take 220 mg by mouth 2 (two) times daily with a meal.     No current facility-administered medications on file prior to visit.    Review of Systems:  As per HPI- otherwise negative.   Physical Examination: Filed Vitals:   10/30/15 1015  Pulse: 92  Temp: 98.5 F (36.9 C)   Filed Vitals:   10/30/15 1015  Weight: 209 lb 9.6 oz (95.074 kg)   Body mass index is 37.14 kg/(m^2). Ideal Body Weight:    GEN: WDWN, NAD, Non-toxic, A & O x 3, obese, looks well HEENT: Atraumatic, Normocephalic. Neck supple. No masses, No LAD. Bilateral TM wnl, oropharynx normal.  PEERL,EOMI.   Ears and Nose: No external deformity. CV: RRR, No M/G/R. No JVD. No thrill. No extra heart sounds. PULM: CTA B, no wheezes, crackles, rhonchi. No retractions. No resp. distress. No accessory muscle use. EXTR: No c/c/e NEURO Normal gait.  PSYCH:  Normally interactive. Conversant. Not depressed or anxious appearing.  Calm demeanor.   Albuterol neb: she felt better   Assessment and Plan: Cough - Plan: albuterol (PROVENTIL) (2.5 MG/3ML) 0.083% nebulizer solution 2.5 mg, albuterol (PROVENTIL HFA;VENTOLIN HFA) 108 (90 Base) MCG/ACT inhaler  Bronchospasm - Plan: albuterol (PROVENTIL) (2.5 MG/3ML) 0.083% nebulizer solution 2.5 mg, albuterol (PROVENTIL HFA;VENTOLIN HFA) 108 (90 Base) MCG/ACT inhaler  Allergy, initial encounter - Plan: albuterol (PROVENTIL HFA;VENTOLIN HFA) 108 (90 Base) MCG/ACT inhaler  Malaise - Plan: POCT Influenza A/B  Fibromyalgia - Plan: cyclobenzaprine (FLEXERIL) 10 MG tablet  Acute bronchitis due to other  specified organisms - Plan: azithromycin (ZITHROMAX) 250 MG tablet  Here today with likely viral URI Refilled her albuterol to use at home Supportive care and guidance offered Refilled her flexeril to have on hand Given rx for zpack to hold and use if not better in the next few days See patient instructions for more details.     Signed Abbe AmsterdamJessica Collene Massimino, MD

## 2015-10-30 NOTE — Patient Instructions (Addendum)
I hope that you feel much better soon- let me know if you do not!    The inhaler and flexeril are at the drug store Hold onto the azithromycin rx and start if you are not improving over the next few days-  however I think that you have a viral illness at least at this point Rest, fluids, tylenol as needed

## 2016-04-28 ENCOUNTER — Emergency Department (HOSPITAL_BASED_OUTPATIENT_CLINIC_OR_DEPARTMENT_OTHER)
Admission: EM | Admit: 2016-04-28 | Discharge: 2016-04-28 | Disposition: A | Discharge status: Home / Self Care | Payer: 59 | Attending: Emergency Medicine | Admitting: Emergency Medicine

## 2016-04-28 ENCOUNTER — Emergency Department (HOSPITAL_BASED_OUTPATIENT_CLINIC_OR_DEPARTMENT_OTHER): Payer: 59

## 2016-04-28 ENCOUNTER — Encounter (HOSPITAL_BASED_OUTPATIENT_CLINIC_OR_DEPARTMENT_OTHER): Payer: Self-pay | Admitting: *Deleted

## 2016-04-28 DIAGNOSIS — R05 Cough: Secondary | ICD-10-CM | POA: Diagnosis not present

## 2016-04-28 DIAGNOSIS — R059 Cough, unspecified: Secondary | ICD-10-CM

## 2016-04-28 DIAGNOSIS — R0789 Other chest pain: Secondary | ICD-10-CM | POA: Insufficient documentation

## 2016-04-28 DIAGNOSIS — R079 Chest pain, unspecified: Secondary | ICD-10-CM | POA: Diagnosis present

## 2016-04-28 LAB — CBC
HCT: 35.5 % — ABNORMAL LOW (ref 36.0–46.0)
HEMOGLOBIN: 12.2 g/dL (ref 12.0–15.0)
MCH: 31.8 pg (ref 26.0–34.0)
MCHC: 34.4 g/dL (ref 30.0–36.0)
MCV: 92.4 fL (ref 78.0–100.0)
PLATELETS: 219 10*3/uL (ref 150–400)
RBC: 3.84 MIL/uL — AB (ref 3.87–5.11)
RDW: 11.9 % (ref 11.5–15.5)
WBC: 4.8 10*3/uL (ref 4.0–10.5)

## 2016-04-28 LAB — BASIC METABOLIC PANEL
ANION GAP: 6 (ref 5–15)
BUN: 13 mg/dL (ref 6–20)
CHLORIDE: 108 mmol/L (ref 101–111)
CO2: 27 mmol/L (ref 22–32)
CREATININE: 0.65 mg/dL (ref 0.44–1.00)
Calcium: 9.2 mg/dL (ref 8.9–10.3)
GFR calc non Af Amer: >60 mL/min (ref >60)
Glucose, Bld: 103 mg/dL — ABNORMAL HIGH (ref 65–99)
POTASSIUM: 3.9 mmol/L (ref 3.5–5.1)
SODIUM: 141 mmol/L (ref 135–145)

## 2016-04-28 LAB — TROPONIN I
Troponin I: <0.03 ng/mL (ref <0.03)
Troponin I: <0.03 ng/mL (ref <0.03)

## 2016-04-28 MED ORDER — IBUPROFEN 800 MG PO TABS
800.0000 mg | ORAL_TABLET | Freq: Once | ORAL | Status: AC
  Administered 2016-04-28: 800 mg via ORAL
  Filled 2016-04-28: qty 1

## 2016-04-28 MED ORDER — BENZONATATE 100 MG PO CAPS: 100.0000 mg | ORAL_CAPSULE | Freq: Three times a day (TID) | ORAL | 0 refills | Status: DC

## 2016-04-28 NOTE — ED Triage Notes (Signed)
Pt reports sudden onset of cough with sob and left sided chest pain one hour ago. Pain radiates to back, shoulder and around her left breast. ekg performed and iv access obtained while pt being triaged.

## 2016-04-28 NOTE — ED Provider Notes (Signed)
[facility] Provider Note   CSN: [id number] Arrival date & time: 04/28/16  0906     History   Chief Complaint Chief Complaint  Patient presents with  . Chest Pain    HPI Jane Fletcher is a 40 y.o. female.  40 year old African-American female past medical history significant for anxiety and fibromyalgia presents to the ED today with left-sided chest pain, cough, shortness of breath prior to arrival. Patient states that she woke up around 6 AM this morning after waking she developed left-sided chest pain. It was acute in onset. The pain radiated to her left arm left shoulder and under her left breast. The pain lasted for approximately 10 minutes. The pain has gradually improved. She has not taken anything for the pain. Moving makes the pain worse. Chest pain is not associated with exertion. Denies any diaphoresis, nausea, emesis. She also endorses feeling some palpitations during the chest pain event this morning. Patient states that she was mildly short of breath because it was difficult to take a deep breath due to pain. Patient also endorses a cough that started on arrival to ED. It is nonproductive. Patient denies any chest pain this time states that her chest feels sore. Denies any cardiac history. Denies any significant family cardiac history of less than 50. Denies any hormone use, recent hospitalizations/surgeries, prolonged immobilizations, tobacco use, unilateral leg swelling, history of DVTs.       Past Medical History:  Diagnosis Date  . Allergy   . Anxiety   . Fibromyalgia     Patient Active Problem List   Diagnosis Date Noted  . Fibromyalgia 12/30/2014  . Obesity 12/30/2014    Past Surgical History:  Procedure Laterality Date  . CHOLECYSTECTOMY      OB History    No data available       Home Medications    Prior to Admission medications   Medication Sig Start Date End Date Taking? Authorizing Provider  albuterol (PROVENTIL  HFA;VENTOLIN HFA) 108 (90 Base) MCG/ACT inhaler Inhale 2 puffs into the lungs every 6 (six) hours as needed for wheezing or shortness of breath. 10/30/15   Gwenlyn FoundJessica C Copland, MD  cetirizine (ZYRTEC) 10 MG tablet Take 1 tablet (10 mg total) by mouth daily. 12/24/14   Jonita Albeehris W Guest, MD  cyclobenzaprine (FLEXERIL) 10 MG tablet Take 1 tablet (10 mg total) by mouth at bedtime. Use as needed for muscle pain 10/30/15   Gwenlyn FoundJessica C Copland, MD  naproxen sodium (ANAPROX) 220 MG tablet Take 220 mg by mouth 2 (two) times daily with a meal.    Historical Provider, MD    Family History Family History  Problem Relation Age of Onset  . Hypertension Mother   . Mental illness Sister   . Migraines Sister     Social History Social History  Substance Use Topics  . Smoking status: Never Smoker  . Smokeless tobacco: Never Used  . Alcohol use No     Allergies   Penicillins   Review of Systems Review of Systems  Constitutional: Negative for chills, diaphoresis, fatigue and fever.  HENT: Negative for congestion, ear pain, rhinorrhea and sore throat.   Eyes: Negative for pain and visual disturbance.  Respiratory: Negative for cough and shortness of breath.   Cardiovascular: Positive for chest pain. Negative for palpitations and leg swelling.  Gastrointestinal: Negative for abdominal pain, diarrhea, nausea and vomiting.  Genitourinary: Negative for dysuria, flank pain, frequency, hematuria and urgency.  Musculoskeletal: Negative for arthralgias and back  pain.  Skin: Negative for color change and rash.  Neurological: Negative for dizziness, syncope, weakness, light-headedness and headaches.  All other systems reviewed and are negative.    Physical Exam Updated Vital Signs BP 139/80   Pulse 69   Temp 98.8 F (37.1 C) (Oral)   Resp 17   Ht 5\' 3"  (1.6 m)   Wt 90.7 kg   LMP 04/26/2016   SpO2 98%   BMI 35.43 kg/m   Physical Exam  Constitutional: She appears well-developed and well-nourished. No  distress.  HENT:  Head: Normocephalic and atraumatic.  Mouth/Throat: Oropharynx is clear and moist.  Eyes: Conjunctivae are normal. Pupils are equal, round, and reactive to light. Right eye exhibits no discharge. Left eye exhibits no discharge. No scleral icterus.  Neck: Normal range of motion. Neck supple. No thyromegaly present.  Cardiovascular: Normal rate, regular rhythm, normal heart sounds and intact distal pulses.  Exam reveals no gallop and no friction rub.   No murmur heard. Pulses:      Radial pulses are 2+ on the right side, and 2+ on the left side.       Dorsalis pedis pulses are 2+ on the right side, and 2+ on the left side.  No edema noted in lower extremities  Pulmonary/Chest: Effort normal and breath sounds normal. No respiratory distress. She has no wheezes. She exhibits tenderness (Significantly TTP over the left lateral chest wall, under left breast.).  Abdominal: Soft. Bowel sounds are normal. She exhibits no distension and no mass. There is no tenderness. There is no rebound and no guarding.  Musculoskeletal: Normal range of motion.  No lower extremity edema  Lymphadenopathy:    She has no cervical adenopathy.  Neurological: She is alert.  Skin: Skin is warm and dry. Capillary refill takes less than 2 seconds.  Vitals reviewed.    ED Treatments / Results  Labs (all labs ordered are listed, but only abnormal results are displayed) Labs Reviewed  BASIC METABOLIC PANEL - Abnormal; Notable for the following:       Result Value   Glucose, Bld 103 (*)    All other components within normal limits  CBC - Abnormal; Notable for the following:    RBC 3.84 (*)    HCT 35.5 (*)    All other components within normal limits  TROPONIN I  TROPONIN I    EKG  EKG Interpretation  Date/Time:  Wednesday April 28 2016 09:12:35 EDT Ventricular Rate:  92 PR Interval:    QRS Duration: 76 QT Interval:  336 QTC Calculation: 416 R Axis:   41 Text Interpretation:  Sinus  arrhythmia no acute ST/T changes no significant change since 2015 Confirmed by GOLDSTON MD, SCOTT [phone]) on 04/28/2016 9:17:46 AM       Radiology Dg Chest 2 View  Result Date: 04/28/2016 CLINICAL DATA:  Chest pain today. EXAM: CHEST  2 VIEW COMPARISON:  05/28/2014 FINDINGS: The heart size and mediastinal contours are within normal limits. Both lungs are clear. The visualized skeletal structures are unremarkable. IMPRESSION: Normal chest x-ray Electronically Signed   By: Rudie Meyer M.D.   On: 04/28/2016 09:39    Procedures Procedures (including critical care time)  Medications Ordered in ED Medications  ibuprofen (ADVIL,MOTRIN) tablet 800 mg (800 mg Oral Given 04/28/16 1025)     Initial Impression / Assessment and Plan / ED Course  I have reviewed the triage vital signs and the nursing notes.  Pertinent labs & imaging results that were available during  my care of the patient were reviewed by me and considered in my medical decision making (see chart for details).  Clinical Course  Patient is to be discharged with recommendation to follow up with PCP in regards to today's hospital visit. Chest pain is not likely of cardiac or pulmonary etiology d/t presentation, perc negative, VSS, no tracheal deviation, no JVD or new murmur, RRR, breath sounds equal bilaterally, EKG without acute abnormalities, negative troponin, and negative CXR. Concerning for infectious etiology. Pt has been advised return to the ED is CP becomes exertional, associated with diaphoresis or nausea, radiates to left jaw/arm, worsens or becomes concerning in any way. Pt appears reliable for follow up and is agreeable to discharge. Chest wall pain improved with ibuprofen. Hemodynamically stable. Discharged home in no acute distress stable vital signs.  Case has been discussed with and seen by Dr. Criss Alvine who agrees with the above plan to discharge.    Final Clinical Impressions(s) / ED Diagnoses   Final diagnoses:    Chest pain, unspecified chest pain type  Cough    New Prescriptions Discharge Medication List as of 04/28/2016  1:08 PM    START taking these medications   Details  benzonatate (TESSALON) 100 MG capsule Take 1 capsule (100 mg total) by mouth every 8 (eight) hours., Starting Wed 04/28/2016, Print         Rise Mu, PA-C 04/28/16 1610    Pricilla Loveless, MD 05/01/16 [phone]

## 2016-04-28 NOTE — Discharge Instructions (Signed)
Take tylenol and ibuprofen for pain. May take tessalon as needed for cough. Please follow up with primary doctor if symptoms worsen for possible cardiology referral. Return to the ED if you cp becomes exertional, associated with vomiting or sweating.

## 2016-05-05 ENCOUNTER — Ambulatory Visit (INDEPENDENT_AMBULATORY_CARE_PROVIDER_SITE_OTHER): Payer: 59 | Admitting: Family Medicine

## 2016-05-05 VITALS — BP 126/84 | HR 86 | Temp 98.5°F | Ht 63.5 in | Wt 217.0 lb

## 2016-05-05 DIAGNOSIS — M797 Fibromyalgia: Secondary | ICD-10-CM

## 2016-05-05 DIAGNOSIS — R0789 Other chest pain: Secondary | ICD-10-CM

## 2016-05-05 DIAGNOSIS — T7840XA Allergy, unspecified, initial encounter: Secondary | ICD-10-CM | POA: Diagnosis not present

## 2016-05-05 DIAGNOSIS — Z23 Encounter for immunization: Secondary | ICD-10-CM

## 2016-05-05 MED ORDER — CYCLOBENZAPRINE HCL 10 MG PO TABS: 10.0000 mg | ORAL_TABLET | Freq: Two times a day (BID) | ORAL | 1 refills | Status: DC | PRN

## 2016-05-05 MED ORDER — CETIRIZINE HCL 10 MG PO TABS: 10.0000 mg | ORAL_TABLET | Freq: Every day | ORAL | 3 refills | Status: DC

## 2016-05-05 NOTE — Progress Notes (Signed)
[facility] at [facility] [address], [address] [place], [place] [address] [phone]  Date:  05/05/2016   Name:  Jane Fletcher   DOB:  May 28, 1976   MRN:  [id number]  PCP:  Abbe Amsterdam, MD    Chief Complaint: Hospitalization Follow-up (Pt was seen in the ER on 04/28/16 for sharp chest pain. Pt states that since ER visit she has had 2-3 episodes of dull chest pain that is not as bad as before. Would like to get Tetanus vaccine today.)   History of Present Illness:  Jane Fletcher is a 40 y.o. very pleasant female patient who presents with the following:  Here today for hospital follow-up. She was seen in the ER a week ago with CP, had evaluation that was low risk and was released to home. It seems that she was having MSK pain or perhaps a viral illness She has felt better but "have just been feeling exhausted" since the ER No fever, no cough Some loss of appetite.   No exertional CP.  She has noted occasional CP since the ER- none now- but notes that her chest is sore to the touch Never had a stress test or other advanced cardiac evaluation- no known history of CAD.   She is a never smoker She is not sure of any family history of CAD, but there is HTN and DM She has not had CP like this in the past, but may have "minor heart palpitations" at times  LMP 9/11 She woud llike to update her tetanus shot today- her last tetanus was 10- 15 years ago, she would like tdap Declines flu shot She is out of her flexeril which she has used for [facility] wonders if this might help with her chest pain   Patient Active Problem List   Diagnosis Date Noted  . Fibromyalgia 12/30/2014  . Obesity 12/30/2014    Past Medical History:  Diagnosis Date  . Allergy   . Anxiety   . Fibromyalgia     Past Surgical History:  Procedure Laterality Date  . CHOLECYSTECTOMY      Social History  Substance Use Topics  . Smoking status: Never Smoker   . Smokeless tobacco: Never Used  . Alcohol use No    Family History  Problem Relation Age of Onset  . Hypertension Mother   . Mental illness Sister   . Migraines Sister     Allergies  Allergen Reactions  . Penicillins Hives    Medication list has been reviewed and updated.  Current Outpatient Prescriptions on File Prior to Visit  Medication Sig Dispense Refill  . albuterol (PROVENTIL HFA;VENTOLIN HFA) 108 (90 Base) MCG/ACT inhaler Inhale 2 puffs into the lungs every 6 (six) hours as needed for wheezing or shortness of breath. 1 Inhaler 3  . benzonatate (TESSALON) 100 MG capsule Take 1 capsule (100 mg total) by mouth every 8 (eight) hours. 21 capsule 0  . cetirizine (ZYRTEC) 10 MG tablet Take 1 tablet (10 mg total) by mouth daily. 30 tablet 11  . cyclobenzaprine (FLEXERIL) 10 MG tablet Take 1 tablet (10 mg total) by mouth at bedtime. Use as needed for muscle pain 30 tablet 1  . naproxen sodium (ANAPROX) 220 MG tablet Take 220 mg by mouth 2 (two) times daily with a meal.     No current facility-administered medications on file prior to visit.     Review of Systems:  As per HPI- otherwise negative.  Physical Examination: Vitals:   05/05/16 1606  BP: 126/84  Pulse: 86  Temp: 98.5 F (36.9 C)   Vitals:   05/05/16 1606  Weight: 217 lb (98.4 kg)  Height: 5' 3.5" (1.613 m)   Body mass index is 37.84 kg/m. Ideal Body Weight: Weight in (lb) to have BMI = 25: 143.1  GEN: WDWN, NAD, Non-toxic, A & O x 3, obese, looks well HEENT: Atraumatic, Normocephalic. Neck supple. No masses, No LAD. Bilateral TM wnl, oropharynx normal.  PEERL,EOMI.   Ears and Nose: No external deformity. CV: RRR, No M/G/R. No JVD. No thrill. No extra heart sounds. I am easily able to reproduce her CP by pressing on her chest wall  PULM: CTA B, no wheezes, crackles, rhonchi. No retractions. No resp. distress. No accessory muscle use. ABD: S, NT, ND. No rebound. No HSM. EXTR: No c/c/e NEURO Normal  gait.  PSYCH: Normally interactive. Conversant. Not depressed or anxious appearing.  Calm demeanor.    Assessment and Plan: Other chest pain  Allergy, initial encounter - Plan: cetirizine (ZYRTEC) 10 MG tablet  Fibromyalgia - Plan: cyclobenzaprine (FLEXERIL) 10 MG tablet  Immunization due - Plan: Tdap vaccine greater than or equal to 7yo IM  At this time I feel reassured that her CP is not cardiac in origin. Offered cardiology eval if she would like- she declines but will monitor her sx and let me know if she gets concerned or does want to see cardiology Refilled her flexeril that she can use for her chest pain as well tdap today Refilled her zyrtec for allergies   Signed Abbe AmsterdamJessica Tayson Schnelle, MD

## 2016-05-05 NOTE — Patient Instructions (Signed)
It was very nice to see you today- take care. You got your tdap shot today- tetanus booster. At this time I think your chest pain is due to pain in the chest wall- flexeril may be helpful for this. If this does not go away or if you do decide you want cardiology follow-up please let me know

## 2016-08-31 ENCOUNTER — Ambulatory Visit (INDEPENDENT_AMBULATORY_CARE_PROVIDER_SITE_OTHER): Payer: 59 | Admitting: Physician Assistant

## 2016-08-31 ENCOUNTER — Encounter: Payer: Self-pay | Admitting: Physician Assistant

## 2016-08-31 VITALS — BP 136/75 | HR 109 | Temp 98.1°F | Ht 63.0 in | Wt 224.2 lb

## 2016-08-31 DIAGNOSIS — J0101 Acute recurrent maxillary sinusitis: Secondary | ICD-10-CM | POA: Diagnosis not present

## 2016-08-31 LAB — POCT INFLUENZA A/B
INFLUENZA A, POC: NEGATIVE
INFLUENZA B, POC: NEGATIVE

## 2016-08-31 MED ORDER — DOXYCYCLINE HYCLATE 100 MG PO TABS: 100.0000 mg | ORAL_TABLET | Freq: Two times a day (BID) | ORAL | 0 refills | Status: DC

## 2016-08-31 NOTE — Progress Notes (Signed)
Subjective:    Patient ID: Jane Fletcher, female    DOB: 11/04/1975, 41 y.o.   MRN: [id number]  HPI  Jane Fletcher is a 41 y/o female who reports nasal congestion, headaches, chest pain with cough, body aches with h/o fibromyalagia and loss of appetite. Symptoms started Friday. She states that everyone at work has been sick. Using Tylenol Cold and Sinus and Flonase. History of sinus infection. Eating some, drinking a lot of water. Has had chills but no fever. Hx of seasonal asthma. No albuterol use.  Has felt "run-down" for two weeks. History of fibromyalgia. Does not smoke.  March 2017 was last use of antibiotics.  Review of Systems  See HPI  Past Medical History:  Diagnosis Date  . Allergy   . Anxiety   . Fibromyalgia      Social History   Social History  . Marital status: Married    Spouse name: N/A  . Number of children: N/A  . Years of education: N/A   Occupational History  . Not on file.   Social History Main Topics  . Smoking status: Never Smoker  . Smokeless tobacco: Never Used  . Alcohol use No  . Drug use: No  . Sexual activity: No   Other Topics Concern  . Not on file   Social History Narrative  . No narrative on file    Past Surgical History:  Procedure Laterality Date  . CHOLECYSTECTOMY      Family History  Problem Relation Age of Onset  . Hypertension Mother   . Mental illness Sister   . Migraines Sister     Allergies  Allergen Reactions  . Penicillins Hives    Current Outpatient Prescriptions on File Prior to Visit  Medication Sig Dispense Refill  . cetirizine (ZYRTEC) 10 MG tablet Take 1 tablet (10 mg total) by mouth daily. 90 tablet 3  . cyclobenzaprine (FLEXERIL) 10 MG tablet Take 1 tablet (10 mg total) by mouth 2 (two) times daily as needed for muscle spasms. Use as needed for muscle pain 30 tablet 1  . naproxen sodium (ANAPROX) 220 MG tablet Take 220 mg by mouth 2 (two) times daily with a meal.    . albuterol  (PROVENTIL HFA;VENTOLIN HFA) 108 (90 Base) MCG/ACT inhaler Inhale 2 puffs into the lungs every 6 (six) hours as needed for wheezing or shortness of breath. (Patient not taking: Reported on 08/31/2016) 1 Inhaler 3  . benzonatate (TESSALON) 100 MG capsule Take 1 capsule (100 mg total) by mouth every 8 (eight) hours. (Patient not taking: Reported on 08/31/2016) 21 capsule 0   No current facility-administered medications on file prior to visit.     BP 136/75   Pulse (!) 109   Temp 98.1 F (36.7 C) (Oral)   Ht 5\' 3"  (1.6 m)   Wt 224 lb 3.2 oz (101.7 kg)   LMP 08/24/2016   SpO2 97%   BMI 39.72 kg/m        Objective:   Physical Exam  Constitutional: She appears well-developed and well-nourished. She is cooperative.  HENT:  Head: Normocephalic and atraumatic.  Right Ear: Tympanic membrane, external ear and ear canal normal. Tympanic membrane is not erythematous, not retracted and not bulging.  Left Ear: Tympanic membrane, external ear and ear canal normal. Tympanic membrane is not erythematous, not retracted and not bulging.  Nose: Right sinus exhibits maxillary sinus tenderness and frontal sinus tenderness. Left sinus exhibits maxillary sinus tenderness and frontal sinus tenderness.  Mouth/Throat: Uvula is midline. Posterior oropharyngeal erythema present. No posterior oropharyngeal edema.  Maxillary tenderness >> Frontal tenderness  Cardiovascular: Regular rhythm and normal heart sounds.  Tachycardia present.   Pulmonary/Chest: Effort normal and breath sounds normal. No accessory muscle usage. No respiratory distress.  Lungs clear to auscultation bilaterally, no crackles, wheezes, ronchi  Lymphadenopathy:    She has no cervical adenopathy.  Neurological: She is alert.  Nursing note and vitals reviewed.   Results for orders placed or performed in visit on 08/31/16  POCT Influenza A/B  Result Value Ref Range   Influenza A, POC Negative Negative   Influenza B, POC Negative Negative        Assessment & Plan:  1. Acute recurrent maxillary sinusitis - POCT Influenza A/B --> negative Given history and physical, will treat with Doxycycline for sinusitis per orders (PCN allergic). Recommended Delsym for cough. Push fluids. Rest. Follow-up with [place] if worsening symptoms, or lack of improvement with treatment.  Jarold Motto PA-C 08/31/16

## 2016-08-31 NOTE — Progress Notes (Signed)
Pre visit review using our clinic review tool, if applicable. No additional management support is needed unless otherwise documented below in the visit note. 

## 2016-08-31 NOTE — Patient Instructions (Signed)
It was great meeting you today!  Please take the antibiotic as prescribed. Use over the counter cough medicine, such as Delsym for your symptoms.  Let us know if your symptoms do not improve or if they worsen despite our treatment. Be sure to let us know if you have worsening fever or shortness of breath.

## 2016-09-06 ENCOUNTER — Ambulatory Visit (INDEPENDENT_AMBULATORY_CARE_PROVIDER_SITE_OTHER): Payer: 59 | Admitting: Medical

## 2016-09-06 ENCOUNTER — Encounter: Payer: Self-pay | Admitting: Medical

## 2016-09-06 VITALS — BP 120/79 | HR 98 | Temp 98.5°F | Wt 222.2 lb

## 2016-09-06 DIAGNOSIS — J111 Influenza due to unidentified influenza virus with other respiratory manifestations: Secondary | ICD-10-CM | POA: Diagnosis not present

## 2016-09-06 DIAGNOSIS — J0100 Acute maxillary sinusitis, unspecified: Secondary | ICD-10-CM

## 2016-09-06 MED ORDER — METHYLPREDNISOLONE ACETATE 40 MG/ML IJ SUSP
40.0000 mg | Freq: Once | INTRAMUSCULAR | Status: AC
  Administered 2016-09-06: 40 mg via INTRAMUSCULAR

## 2016-09-06 MED ORDER — OSELTAMIVIR PHOSPHATE 75 MG PO CAPS: 75.0000 mg | ORAL_CAPSULE | Freq: Two times a day (BID) | ORAL | 0 refills | Status: DC

## 2016-09-06 NOTE — Patient Instructions (Signed)
For persisting sinus pressure continue the doxycycline.(if after additional 3 days any persisting pain will consider switching to levofloxin)  depomedrol 40 mg im given today to give some sinus pressure relief. This may help you muscle aches as well.  Will go ahead and give you tamiflu as you had worse flu like symptoms over weekend and rapid testing can be falsely negative.  Follow 7 days or as needed

## 2016-09-06 NOTE — Addendum Note (Signed)
Addended by: Regis BillSCATES, SHARON L on: 09/06/2016 06:07 PM   Modules accepted: Orders

## 2016-09-06 NOTE — Progress Notes (Signed)
Subjective:    Patient ID: Jane Fletcher, female    DOB: [birth year], 41 y.o.   MRN: [id number]  HPI  Pt in for sinus pressure and pain for about one week. Pt states this past Tuesday she flu test last week but test was negative.   Pt was given doxycycline for sinus infection. Pt still has sinus pressure and nasal congestion.  Pt states that her achiness did get worse this weekend and even worse this weekend. some chills Pt states she is still tired and not recovering energy wise.   LMP- August 18, 2016.    Review of Systems  Constitutional: Positive for chills. Negative for fatigue and fever.  HENT: Positive for congestion, postnasal drip, sinus pain and sinus pressure. Negative for ear pain and sore throat.   Respiratory: Negative for cough, chest tightness, shortness of breath and wheezing.   Cardiovascular: Negative for chest pain and palpitations.  Gastrointestinal: Negative for abdominal distention, abdominal pain and blood in stool.  Musculoskeletal: Positive for myalgias. Negative for back pain.       Flare up of muscle aches with fatigue and chills.  Skin: Negative for rash.  Neurological: Negative for dizziness and headaches.  Hematological: Negative for adenopathy. Does not bruise/bleed easily.  Psychiatric/Behavioral: Negative for behavioral problems and confusion.    Past Medical History:  Diagnosis Date  . Allergy   . Anxiety   . Fibromyalgia      Social History   Social History  . Marital status: Married    Spouse name: N/A  . Number of children: N/A  . Years of education: N/A   Occupational History  . Not on file.   Social History Main Topics  . Smoking status: Never Smoker  . Smokeless tobacco: Never Used  . Alcohol use No  . Drug use: No  . Sexual activity: No   Other Topics Concern  . Not on file   Social History Narrative  . No narrative on file    Past Surgical History:  Procedure Laterality Date  . CHOLECYSTECTOMY       Family History  Problem Relation Age of Onset  . Hypertension Mother   . Mental illness Sister   . Migraines Sister     Allergies  Allergen Reactions  . Penicillins Hives    Current Outpatient Prescriptions on File Prior to Visit  Medication Sig Dispense Refill  . albuterol (PROVENTIL HFA;VENTOLIN HFA) 108 (90 Base) MCG/ACT inhaler Inhale 2 puffs into the lungs every 6 (six) hours as needed for wheezing or shortness of breath. 1 Inhaler 3  . benzonatate (TESSALON) 100 MG capsule Take 1 capsule (100 mg total) by mouth every 8 (eight) hours. 21 capsule 0  . cetirizine (ZYRTEC) 10 MG tablet Take 1 tablet (10 mg total) by mouth daily. 90 tablet 3  . cyclobenzaprine (FLEXERIL) 10 MG tablet Take 1 tablet (10 mg total) by mouth 2 (two) times daily as needed for muscle spasms. Use as needed for muscle pain 30 tablet 1  . doxycycline (VIBRA-TABS) 100 MG tablet Take 1 tablet (100 mg total) by mouth 2 (two) times daily. 20 tablet 0  . naproxen sodium (ANAPROX) 220 MG tablet Take 220 mg by mouth 2 (two) times daily with a meal.     No current facility-administered medications on file prior to visit.     BP 120/79 (BP Location: Left Arm, Patient Position: Sitting, Cuff Size: Large)   Pulse 98   Temp 98.5 F (36.9 C) (Oral)  Wt 222 lb 3.2 oz (100.8 kg)   LMP 08/24/2016   SpO2 100% Comment: RA  BMI 39.36 kg/m       Objective:   Physical Exam  General  Mental Status - Alert. General Appearance - Well groomed. Not in acute distress.  Skin Rashes- No Rashes.  HEENT Head- Normal. Ear Auditory Canal - Left- Normal. Right - Normal.Tympanic Membrane- Left- Normal. Right- Normal. Eye Sclera/Conjunctiva- Left- Normal. Right- Normal. Nose & Sinuses Nasal Mucosa- Left-  Boggy and Congested. Right-  Boggy and  Congested.Bilateral maxillary and frontal sinus pressure. Mouth & Throat Lips: Upper Lip- Normal: no dryness, cracking, pallor, cyanosis, or vesicular eruption. Lower  Lip-Normal: no dryness, cracking, pallor, cyanosis or vesicular eruption. Buccal Mucosa- Bilateral- No Aphthous ulcers. Oropharynx- No Discharge or Erythema. Tonsils: Characteristics- Bilateral- No Erythema or Congestion. Size/Enlargement- Bilateral- No enlargement. Discharge- bilateral-None.  Neck Neck- Supple. No Masses.   Chest and Lung Exam Auscultation: Breath Sounds:-Clear even and unlabored.  Cardiovascular Auscultation:Rythm- Regular, rate and rhythm. Murmurs & Other Heart Sounds:Ausculatation of the heart reveal- No Murmurs.  Lymphatic Head & Neck General Head & Neck Lymphatics: Bilateral: Description- No Localized lymphadenopathy.       Assessment & Plan:  For persisting sinus pressure continue the doxycycline.(if after additional 3 days any persisting pain will consider switching to levofloxin)  depomedrol 40 mg im given today to give some sinus pressure relief. This may help you muscle aches as well.  Will go ahead and give you tamiflu as you had worse flu like symptoms over weekend and rapid testing can be falsely negative.  Follow 7 days or as needed

## 2016-09-06 NOTE — Progress Notes (Signed)
Pre visit review using our clinic review tool, if applicable. No additional management support is needed unless otherwise documented below in the visit note/SLS  

## 2016-09-17 ENCOUNTER — Telehealth: Payer: Self-pay | Admitting: Family Medicine

## 2016-09-17 NOTE — Telephone Encounter (Signed)
°  Relation to pt: self Call back [phone] Pharmacy: [facility] [address]  Reason for call: pt was seen on 09/06/16 states Edward informed her to contact him if the antibiotics did not work. Pt states she has taken all the meds, and she still have a lot of sinus pressure, states also this morning she experience some possible veritgo states she got very dizzy. Would like to know if Ramon Dredgedward will call her in another antibiotic.

## 2016-09-18 ENCOUNTER — Emergency Department (HOSPITAL_BASED_OUTPATIENT_CLINIC_OR_DEPARTMENT_OTHER)
Admission: EM | Admit: 2016-09-18 | Discharge: 2016-09-18 | Disposition: A | Discharge status: Home / Self Care | Payer: 59 | Attending: Emergency Medicine | Admitting: Emergency Medicine

## 2016-09-18 ENCOUNTER — Encounter (HOSPITAL_BASED_OUTPATIENT_CLINIC_OR_DEPARTMENT_OTHER): Payer: Self-pay | Admitting: Emergency Medicine

## 2016-09-18 DIAGNOSIS — G43009 Migraine without aura, not intractable, without status migrainosus: Secondary | ICD-10-CM | POA: Diagnosis not present

## 2016-09-18 DIAGNOSIS — G43909 Migraine, unspecified, not intractable, without status migrainosus: Secondary | ICD-10-CM | POA: Diagnosis not present

## 2016-09-18 MED ORDER — KETOROLAC TROMETHAMINE 30 MG/ML IJ SOLN
30.0000 mg | Freq: Once | INTRAMUSCULAR | Status: AC
  Administered 2016-09-18: 30 mg via INTRAVENOUS
  Filled 2016-09-18: qty 1

## 2016-09-18 MED ORDER — DIPHENHYDRAMINE HCL 50 MG/ML IJ SOLN
25.0000 mg | Freq: Once | INTRAMUSCULAR | Status: AC
  Administered 2016-09-18: 25 mg via INTRAVENOUS
  Filled 2016-09-18: qty 1

## 2016-09-18 MED ORDER — GUAIFENESIN ER 1200 MG PO TB12: 1.0000 | ORAL_TABLET | Freq: Two times a day (BID) | ORAL | 0 refills | Status: DC

## 2016-09-18 MED ORDER — PREDNISONE 50 MG PO TABS: 50.0000 mg | ORAL_TABLET | Freq: Every day | ORAL | 0 refills | Status: DC

## 2016-09-18 MED ORDER — SODIUM CHLORIDE 0.9 % IV BOLUS (SEPSIS)
1000.0000 mL | Freq: Once | INTRAVENOUS | Status: AC
  Administered 2016-09-18: 1000 mL via INTRAVENOUS

## 2016-09-18 MED ORDER — PROCHLORPERAZINE EDISYLATE 5 MG/ML IJ SOLN
10.0000 mg | Freq: Once | INTRAMUSCULAR | Status: AC
  Administered 2016-09-18: 10 mg via INTRAVENOUS
  Filled 2016-09-18: qty 2

## 2016-09-18 NOTE — ED Triage Notes (Signed)
Patient reports that she went to her MD on the 21st and was treated for the flu. The patient also has had a recent sinus infection. Since then she has had a headache and facial pain. The patient reports that she has a "migraine" that she has been treating with OTC aleve

## 2016-09-18 NOTE — ED Provider Notes (Signed)
[facility] Provider Note   CSN: [id number] Arrival date & time: 09/18/16  1322     History   Chief Complaint Chief Complaint  Patient presents with  . Headache    HPI Jane Fletcher is a 41 y.o. female.  HPI Patient presents to the emergency department with a green headache that started 2 days, give the patient, states she has been dealing of the recent sinusitis and finished antibiotics several days ago.  She states that nothing seems make the condition better, states light makes the headache worse.  Patient states that she did not take any medications prior to arrival. The patient denies chest pain, shortness of breath, blurred vision, neck pain, fever, cough, weakness, numbness, dizziness, anorexia, edema, abdominal pain, nausea, vomiting, diarrhea, rash, back pain, dysuria, hematemesis, bloody stool, near syncope, or syncope. Past Medical History:  Diagnosis Date  . Allergy   . Anxiety   . Fibromyalgia     Patient Active Problem List   Diagnosis Date Noted  . Fibromyalgia 12/30/2014  . Obesity 12/30/2014    Past Surgical History:  Procedure Laterality Date  . CHOLECYSTECTOMY      OB History    No data available       Home Medications    Prior to Admission medications   Medication Sig Start Date End Date Taking? Authorizing Provider  albuterol (PROVENTIL HFA;VENTOLIN HFA) 108 (90 Base) MCG/ACT inhaler Inhale 2 puffs into the lungs every 6 (six) hours as needed for wheezing or shortness of breath. 10/30/15   Gwenlyn Found Copland, MD  benzonatate (TESSALON) 100 MG capsule Take 1 capsule (100 mg total) by mouth every 8 (eight) hours. 04/28/16   Rise Mu, PA-C  cetirizine (ZYRTEC) 10 MG tablet Take 1 tablet (10 mg total) by mouth daily. 05/05/16   Gwenlyn Found Copland, MD  cyclobenzaprine (FLEXERIL) 10 MG tablet Take 1 tablet (10 mg total) by mouth 2 (two) times daily as needed for muscle spasms. Use as needed for muscle pain 05/05/16   Gwenlyn Found  Copland, MD  doxycycline (VIBRA-TABS) 100 MG tablet Take 1 tablet (100 mg total) by mouth 2 (two) times daily. 08/31/16   Jarold Motto, PA  naproxen sodium (ANAPROX) 220 MG tablet Take 220 mg by mouth 2 (two) times daily with a meal.    Historical Provider, MD  oseltamivir (TAMIFLU) 75 MG capsule Take 1 capsule (75 mg total) by mouth 2 (two) times daily. 09/06/16   Esperanza Richters, PA-C    Family History Family History  Problem Relation Age of Onset  . Hypertension Mother   . Mental illness Sister   . Migraines Sister     Social History Social History  Substance Use Topics  . Smoking status: Never Smoker  . Smokeless tobacco: Never Used  . Alcohol use No     Allergies   Penicillins   Review of Systems Review of Systems  All other systems negative except as documented in the HPI. All pertinent positives and negatives as reviewed in the HPI. Physical Exam Updated Vital Signs BP 141/97 (BP Location: Left Arm)   Pulse 97   Temp 98.7 F (37.1 C) (Oral)   Resp 16   Ht 5\' 3"  (1.6 m)   Wt 100.7 kg   LMP 08/24/2016   SpO2 96%   BMI 39.33 kg/m   Physical Exam  Constitutional: She is oriented to person, place, and time. She appears well-developed and well-nourished. No distress.  HENT:  Head: Normocephalic and atraumatic.  Mouth/Throat: Oropharynx is clear and moist.  Eyes: Pupils are equal, round, and reactive to light.  Neck: Normal range of motion. Neck supple.  Cardiovascular: Normal rate, regular rhythm and normal heart sounds.  Exam reveals no gallop and no friction rub.   No murmur heard. Pulmonary/Chest: Effort normal and breath sounds normal. No respiratory distress. She has no wheezes.  Abdominal: Soft. Bowel sounds are normal. She exhibits no distension. There is no tenderness.  Neurological: She is alert and oriented to person, place, and time. No sensory deficit. She exhibits normal muscle tone. Coordination normal.  Skin: Skin is warm and dry. Capillary  refill takes less than 2 seconds. No rash noted. No erythema.  Psychiatric: She has a normal mood and affect. Her behavior is normal.  Nursing note and vitals reviewed.    ED Treatments / Results  Labs (all labs ordered are listed, but only abnormal results are displayed) Labs Reviewed - No data to display  EKG  EKG Interpretation None       Radiology No results found.  Procedures Procedures (including critical care time)  Medications Ordered in ED Medications  sodium chloride 0.9 % bolus 1,000 mL (1,000 mLs Intravenous New Bag/Given 09/18/16 1456)  ketorolac (TORADOL) 30 MG/ML injection 30 mg (30 mg Intravenous Given 09/18/16 1512)  diphenhydrAMINE (BENADRYL) injection 25 mg (25 mg Intravenous Given 09/18/16 1510)  prochlorperazine (COMPAZINE) injection 10 mg (10 mg Intravenous Given 09/18/16 1515)     Initial Impression / Assessment and Plan / ED Course  I have reviewed the triage vital signs and the nursing notes.  Pertinent labs & imaging results that were available during my care of the patient were reviewed by me and considered in my medical decision making (see chart for details).    Patient's headache is completely resolved with IV fluids and medications.  Patient is advised to return here as needed.  Told to follow up with her PCP on the sinusitis.  I advised her this could be some residual because slit edema from the sinusitis.  The patient states she would like to follow up with her doctor for any further antibiotics.  Patient agrees the plan and all questions were answered  Final Clinical Impressions(s) / ED Diagnoses   Final diagnoses:  None    New Prescriptions New Prescriptions   No medications on file     Charlestine NightChristopher Faith Patricelli, PA-C 09/18/16 1557    Geoffery Lyonsouglas Delo, MD 09/19/16 [phone]

## 2016-09-18 NOTE — ED Notes (Signed)
ED Provider at bedside. 

## 2016-09-18 NOTE — Discharge Instructions (Signed)
Return here as needed.  Follow-up with your primary doctor, increase your fluid intake, rest as much as possible

## 2016-09-19 NOTE — Telephone Encounter (Signed)
Pt has persisting sinus pressure despite doxycy

## 2016-09-19 NOTE — Telephone Encounter (Signed)
Pt has persisting sinus pressure despite course of doxycycline. Will rx levofloxin antibiotic. But want to make sure she not late for menses. Last cycle was Aug 18, 2016. Need to make sure she is not pregnant.  If  she is on her cycle then can send in levofloxin 500 mg 1 tab po q day for 10 days. But if late need to get preg test and then consider maybe other antibiotic.   Also she had dizziness/maybe vertigo. Has that resolved or does she still have?

## 2016-09-20 NOTE — Telephone Encounter (Signed)
Called patient. She ended up going to the ER over the weekend. She was prescribed prednisone and guaifenesin.  Pt states she is feeling better today. Denied dizziness/vertigo.  She feels better today.  She was appreciative for the call and agreed to call back with any questions or concerns.

## 2016-12-15 ENCOUNTER — Ambulatory Visit (INDEPENDENT_AMBULATORY_CARE_PROVIDER_SITE_OTHER): Payer: 59 | Admitting: Family Medicine

## 2016-12-15 VITALS — BP 110/80 | Temp 98.8°F | Ht 63.0 in | Wt 222.4 lb

## 2016-12-15 DIAGNOSIS — M797 Fibromyalgia: Secondary | ICD-10-CM | POA: Diagnosis not present

## 2016-12-15 DIAGNOSIS — J0100 Acute maxillary sinusitis, unspecified: Secondary | ICD-10-CM

## 2016-12-15 DIAGNOSIS — E669 Obesity, unspecified: Secondary | ICD-10-CM

## 2016-12-15 MED ORDER — CYCLOBENZAPRINE HCL 10 MG PO TABS: 10.0000 mg | ORAL_TABLET | Freq: Two times a day (BID) | ORAL | 1 refills | Status: DC | PRN

## 2016-12-15 MED ORDER — DOXYCYCLINE HYCLATE 100 MG PO TABS: 100.0000 mg | ORAL_TABLET | Freq: Two times a day (BID) | ORAL | 0 refills | Status: DC

## 2016-12-15 MED ORDER — PHENTERMINE HCL 15 MG PO CAPS: 15.0000 mg | ORAL_CAPSULE | ORAL | 2 refills | Status: DC

## 2016-12-15 NOTE — Patient Instructions (Signed)
We are going to treat your sinus infection with doxycycline- take twice a day for 10 days.  Let me know if not better- Sooner if worse.   I refilled your flexeril to use as needed Try the phentermine 15 mg once a day- this may help with weight loss in combination with diet and exercise  Please see me in 2 months to check on your progress

## 2016-12-15 NOTE — Progress Notes (Signed)
[facility] at [facility] [address], [address] [place], [place] [address] [phone]  Date:  12/15/2016   Name:  Jane Fletcher   DOB:  03/31/1976   MRN:  [id number]  PCP:  Abbe Amsterdam, MD    Chief Complaint: Sinus Problem   History of Present Illness:  Jane Fletcher is a 41 y.o. very pleasant female patient who presents with the following:  She is here today with a possible sinus infection- she was treated for same back in January Her sx went away with doxycyline and steroids and she got back to normal. However she notes return of sx over the last 4-5 days.  She has had stuffy nose, sinus pressure and pain, headache over the last few days No fever noted but she did have some chills Mild cough if she talks too long No GI symptoms noted LMP 4/30- they use fertility awareness for contraception, successfully for the last 8 years   Her daughter was ill not long ago with a URI but is now better She does need a refill of her flexeril which she uses as needed for FBM pain  She is otherwise doing well expect she is concerned about her weight She has treid OTC weight meds in the past but they made her too jittery. Wonders if there is any rx that we can give her  She has tried changing her diet and getting more exercise- she has been trying to lose weight over the last several months.   However her weight does not seem to budge  Wt Readings from Last 3 Encounters:  12/15/16 222 lb 6.4 oz (100.9 kg)  09/18/16 222 lb (100.7 kg)  09/06/16 222 lb 3.2 oz (100.8 kg)     Patient Active Problem List   Diagnosis Date Noted  . Fibromyalgia 12/30/2014  . Obesity 12/30/2014    Past Medical History:  Diagnosis Date  . Allergy   . Anxiety   . Fibromyalgia     Past Surgical History:  Procedure Laterality Date  . CHOLECYSTECTOMY      Social History  Substance Use Topics  . Smoking status: Never Smoker  . Smokeless tobacco:  Never Used  . Alcohol use No    Family History  Problem Relation Age of Onset  . Hypertension Mother   . Mental illness Sister   . Migraines Sister     Allergies  Allergen Reactions  . Penicillins Hives    Medication list has been reviewed and updated.  Current Outpatient Prescriptions on File Prior to Visit  Medication Sig Dispense Refill  . cetirizine (ZYRTEC) 10 MG tablet Take 1 tablet (10 mg total) by mouth daily. 90 tablet 3  . cyclobenzaprine (FLEXERIL) 10 MG tablet Take 1 tablet (10 mg total) by mouth 2 (two) times daily as needed for muscle spasms. Use as needed for muscle pain 30 tablet 1  . naproxen sodium (ANAPROX) 220 MG tablet Take 220 mg by mouth 2 (two) times daily with a meal.    . albuterol (PROVENTIL HFA;VENTOLIN HFA) 108 (90 Base) MCG/ACT inhaler Inhale 2 puffs into the lungs every 6 (six) hours as needed for wheezing or shortness of breath. (Patient not taking: Reported on 12/15/2016) 1 Inhaler 3  . benzonatate (TESSALON) 100 MG capsule Take 1 capsule (100 mg total) by mouth every 8 (eight) hours. (Patient not taking: Reported on 12/15/2016) 21 capsule 0   No current facility-administered medications on file prior to  visit.     Review of Systems:  As per HPI- otherwise negative. No fever, nausea, vomiting, diarrhea, rash, CP or SOB No history of any heart problems Not on any BP medication  Physical Examination: Vitals:   12/15/16 1432  BP: (!) 115/95  Temp: 98.8 F (37.1 C)   Vitals:   12/15/16 1432  Weight: 222 lb 6.4 oz (100.9 kg)  Height:  (1.6 m)   Body mass index is 39.4 kg/m. Ideal Body Weight: Weight in (lb) to have BMI = 25: 140.8  GEN: WDWN, NAD, Non-toxic, A & O x 3, obese, otherwise looks well HEENT: Atraumatic, Normocephalic. Neck supple. No masses, No LAD.  Bilateral TM wnl, oropharynx normal.  PEERL,EOMI.   Nasal cavity is inflamed and sinuses are tender to percussion Ears and Nose: No external deformity.   CV: RRR, No M/G/R.  No JVD. No thrill. No extra heart sounds. PULM: CTA B, no wheezes, crackles, rhonchi. No retractions. No resp. distress. No accessory muscle use. EXTR: No c/c/e NEURO Normal gait.  PSYCH: Normally interactive. Conversant. Not depressed or anxious appearing.  Calm demeanor.    Assessment and Plan: Acute maxillary sinusitis, recurrence not specified - Plan: doxycycline (VIBRA-TABS) 100 MG tablet  Fibromyalgia - Plan: cyclobenzaprine (FLEXERIL) 10 MG tablet  Obesity without serious comorbidity, unspecified classification, unspecified obesity type - Plan: phentermine 15 MG capsule  Here today with recurrent sinusitis and concern of difficulty with weight loss Doxycycline for sinusitis- she will let me know if not better in the next several days Refilled her flexeril Phentermine /d for weight loss She will see me in 2 months to check on her progress   Signed Abbe Amsterdam, MD

## 2016-12-15 NOTE — Progress Notes (Signed)
Pre visit review using our clinic tool,if applicable. No additional management support is needed unless otherwise documented below in the visit note.  

## 2017-01-02 NOTE — Telephone Encounter (Signed)
error 

## 2017-06-13 ENCOUNTER — Ambulatory Visit (INDEPENDENT_AMBULATORY_CARE_PROVIDER_SITE_OTHER): Payer: 59 | Admitting: Medical

## 2017-06-13 ENCOUNTER — Ambulatory Visit (HOSPITAL_BASED_OUTPATIENT_CLINIC_OR_DEPARTMENT_OTHER)
Admission: RE | Admit: 2017-06-13 | Discharge: 2017-06-13 | Disposition: A | Discharge status: Home / Self Care | Payer: 59 | Source: Ambulatory Visit | Attending: Medical | Admitting: Medical

## 2017-06-13 ENCOUNTER — Encounter: Payer: Self-pay | Admitting: Medical

## 2017-06-13 VITALS — BP 141/87 | HR 97 | Temp 98.6°F | Resp 16 | Ht 63.0 in | Wt 224.6 lb

## 2017-06-13 DIAGNOSIS — T7840XA Allergy, unspecified, initial encounter: Secondary | ICD-10-CM

## 2017-06-13 DIAGNOSIS — L089 Local infection of the skin and subcutaneous tissue, unspecified: Secondary | ICD-10-CM

## 2017-06-13 DIAGNOSIS — M25562 Pain in left knee: Secondary | ICD-10-CM

## 2017-06-13 DIAGNOSIS — R6883 Chills (without fever): Secondary | ICD-10-CM | POA: Diagnosis not present

## 2017-06-13 DIAGNOSIS — T148XXA Other injury of unspecified body region, initial encounter: Secondary | ICD-10-CM | POA: Diagnosis not present

## 2017-06-13 DIAGNOSIS — M797 Fibromyalgia: Secondary | ICD-10-CM

## 2017-06-13 DIAGNOSIS — S8992XA Unspecified injury of left lower leg, initial encounter: Secondary | ICD-10-CM | POA: Diagnosis not present

## 2017-06-13 MED ORDER — CETIRIZINE HCL 10 MG PO TABS: 10.0000 mg | ORAL_TABLET | Freq: Every day | ORAL | 3 refills | Status: AC

## 2017-06-13 MED ORDER — TRAMADOL HCL 50 MG PO TABS: 50.0000 mg | ORAL_TABLET | Freq: Three times a day (TID) | ORAL | 0 refills | Status: DC | PRN

## 2017-06-13 MED ORDER — CLINDAMYCIN HCL 150 MG PO CAPS: 150.0000 mg | ORAL_CAPSULE | Freq: Three times a day (TID) | ORAL | 0 refills | Status: DC

## 2017-06-13 MED ORDER — CYCLOBENZAPRINE HCL 10 MG PO TABS: 10.0000 mg | ORAL_TABLET | Freq: Two times a day (BID) | ORAL | 1 refills | Status: DC | PRN

## 2017-06-13 NOTE — Patient Instructions (Signed)
For your contusion of the left knee and probable infection of abrasion, we will get x-ray of knee, CBC and wound culture.  Since she reports chills and fatigue will follow closely to make sure you do not have a deeper infection.  Prescription of clindamycin written for infection pending wound culture results.  For moderate pain can use Aleve and for more severe pain prescription of tramadol given.  Work note written to return on Wednesday.  If x-ray shows fracture then will refer to orthopedist.  Follow-up in 4-6 days or as needed.

## 2017-06-13 NOTE — Progress Notes (Signed)
Subjective:    Patient ID: Jane Fletcher, female    DOB: 05/28/1976, 41 y.o.   MRN: [id number]  HPI Pt fell Friday night on her porch. Saturday abrasion bled a little bit. She fell on concrete of her porch. Sunday felt tired. She had faint subjective chills but no fever. No diffuse bodyaches.   Pt states hurts for her to bend her knee. Pt saw some yellow color to her gauze.  LMP- Oct 22,2019.   Review of Systems  Constitutional: Positive for chills and fatigue.  Respiratory: Negative for cough, shortness of breath and wheezing.   Cardiovascular: Negative for chest pain and palpitations.  Musculoskeletal: Negative for back pain.       Left knee pain  Skin: Negative for rash.       See HPI and exam  Hematological: Negative for adenopathy. Does not bruise/bleed easily.  Psychiatric/Behavioral: Negative for behavioral problems and confusion.   Past Medical History:  Diagnosis Date  . Allergy   . Anxiety   . Fibromyalgia      Social History   Social History  . Marital status: Married    Spouse name: N/A  . Number of children: N/A  . Years of education: N/A   Occupational History  . Not on file.   Social History Main Topics  . Smoking status: Never Smoker  . Smokeless tobacco: Never Used  . Alcohol use No  . Drug use: No  . Sexual activity: No   Other Topics Concern  . Not on file   Social History Narrative  . No narrative on file    Past Surgical History:  Procedure Laterality Date  . CHOLECYSTECTOMY      Family History  Problem Relation Age of Onset  . Hypertension Mother   . Mental illness Sister   . Migraines Sister     Allergies  Allergen Reactions  . Penicillins Hives    Current Outpatient Prescriptions on File Prior to Visit  Medication Sig Dispense Refill  . albuterol (PROVENTIL HFA;VENTOLIN HFA) 108 (90 Base) MCG/ACT inhaler Inhale 2 puffs into the lungs every 6 (six) hours as needed for wheezing or shortness of breath. 1 Inhaler 3   . doxycycline (VIBRA-TABS) 100 MG tablet Take 1 tablet (100 mg total) by mouth 2 (two) times daily. 20 tablet 0  . naproxen sodium (ANAPROX) 220 MG tablet Take 220 mg by mouth 2 (two) times daily with a meal.    . phentermine 15 MG capsule Take 1 capsule (15 mg total) by mouth every morning. 30 capsule 2   No current facility-administered medications on file prior to visit.     BP (!) 141/87   Pulse 97   Temp 98.6 F (37 C) (Oral)   Resp 16   Ht 5\' 3"  (1.6 m)   Wt 224 lb 9.6 oz (101.9 kg)   LMP 06/06/2017   SpO2 97%   BMI 39.79 kg/m       Objective:   Physical Exam  General- No acute distress. Pleasant patient. Neck- Full range of motion, no jvd Lungs- Clear, even and unlabored.  Heart- regular rate and rhythm. Neurologic- CNII- XII grossly intact.  Left knee-patient's knee does not look swollen.  Although she does have the proximal 2.5 cm abrasion under the patella/over patellar tendon region.  No discharge presently over wound.  Wound is mildly tender to palpation.  On flexion and extension of the knee she reports abrasion hurts.  Some moderate direct tenderness over the  patella.      Assessment & Plan:  For your contusion of the left knee and probable infection of abrasion, we will get x-ray of knee, CBC and wound culture.  Since she reports chills and fatigue will follow closely to make sure you do not have a deeper infection.  Prescription of clindamycin written for infection pending wound culture results.  For moderate pain can use Aleve and for more severe pain prescription of tramadol given.  Work note written to return on Wednesday.  If x-ray shows fracture then will refer to orthopedist.  Follow-up in 4-6 days or as needed.  Keghan Mcfarren, Ramon Dredge, PA-C

## 2017-06-14 ENCOUNTER — Telehealth: Payer: Self-pay | Admitting: Medical

## 2017-06-14 LAB — CBC WITH DIFFERENTIAL/PLATELET
BASOS PCT: 1 % (ref 0.0–3.0)
Basophils Absolute: 0.1 10*3/uL (ref 0.0–0.1)
EOS ABS: 0.2 10*3/uL (ref 0.0–0.7)
EOS PCT: 2.8 % (ref 0.0–5.0)
HEMATOCRIT: 36.7 % (ref 36.0–46.0)
HEMOGLOBIN: 12.3 g/dL (ref 12.0–15.0)
LYMPHS PCT: 31.1 % (ref 12.0–46.0)
Lymphs Abs: 2.2 10*3/uL (ref 0.7–4.0)
MCHC: 33.6 g/dL (ref 30.0–36.0)
MCV: 95.1 fl (ref 78.0–100.0)
Monocytes Absolute: 0.6 10*3/uL (ref 0.1–1.0)
Monocytes Relative: 8.1 % (ref 3.0–12.0)
NEUTROS ABS: 4.1 10*3/uL (ref 1.4–7.7)
Neutrophils Relative %: 57 % (ref 43.0–77.0)
PLATELETS: 230 10*3/uL (ref 150.0–400.0)
RBC: 3.86 Mil/uL — ABNORMAL LOW (ref 3.87–5.11)
RDW: 12.7 % (ref 11.5–15.5)
WBC: 7.2 10*3/uL (ref 4.0–10.5)

## 2017-06-14 NOTE — Telephone Encounter (Signed)
Notified pt of message below. Pt voiced understanding.  

## 2017-06-14 NOTE — Telephone Encounter (Signed)
Xray results were negative. Return to work status at this point depends on  level of pain and ability to ambulate. She can go to work on wed then  up pdate us Thursday how she did. If does not do well working can try to refer her sports medicine.

## 2017-06-14 NOTE — Telephone Encounter (Signed)
Please advise 

## 2017-06-14 NOTE — Telephone Encounter (Signed)
Pt called for XRAY results knee. Pt work note states she is to return to work Wed 06/15/17 and to stay off her knee until she gets the xray results.  Is it ok for her to go to work? Please call with xray results early before she leaves for work. She is to be at work at 9:00 am.

## 2017-06-16 LAB — WOUND CULTURE
MICRO NUMBER:: 81209142
RESULT: NO GROWTH
SPECIMEN QUALITY:: ADEQUATE

## 2017-11-16 ENCOUNTER — Encounter: Payer: Self-pay | Admitting: Family

## 2017-11-16 ENCOUNTER — Ambulatory Visit: Payer: 59 | Admitting: Family

## 2017-11-16 VITALS — BP 134/85 | HR 110 | Temp 98.3°F | Resp 16 | Ht 63.0 in | Wt 227.2 lb

## 2017-11-16 DIAGNOSIS — M797 Fibromyalgia: Secondary | ICD-10-CM | POA: Diagnosis not present

## 2017-11-16 DIAGNOSIS — H65191 Other acute nonsuppurative otitis media, right ear: Secondary | ICD-10-CM | POA: Diagnosis not present

## 2017-11-16 MED ORDER — AZITHROMYCIN 250 MG PO TABS: ORAL_TABLET | ORAL | 0 refills | Status: DC

## 2017-11-16 MED ORDER — CYCLOBENZAPRINE HCL 10 MG PO TABS: 10.0000 mg | ORAL_TABLET | Freq: Two times a day (BID) | ORAL | 1 refills | Status: DC | PRN

## 2017-11-16 NOTE — Patient Instructions (Signed)
Begin azithromycin for your early right ear infection.  Continue mucinex and sudafed as needed. You may also try delsym as needed for cough. Call if symptoms worsen or if not improved in 3-4 days.

## 2017-11-16 NOTE — Progress Notes (Signed)
Subjective:    Patient ID: Jane Fletcher, female    DOB: 12-12-1975, 42 y.o.   MRN: [id number]  HPI  Jane Fletcher is a 42 yr old female who woke up on Saturday with c/o cough. She reports that her congestion has been "breaking up" some.  Feels "exhausted." She does not believe that she had a fever. Denies myalgia other than her typical FM pain. Mucinex has helped also has tried sudafed which has helped.    Review of Systems    see HPI  Past Medical History:  Diagnosis Date  . Allergy   . Anxiety   . Fibromyalgia      Social History   Socioeconomic History  . Marital status: Married    Spouse name: Not on file  . Number of children: Not on file  . Years of education: Not on file  . Highest education level: Not on file  Occupational History  . Not on file  Social Needs  . Financial resource strain: Not on file  . Food insecurity:    Worry: Not on file    Inability: Not on file  . Transportation needs:    Medical: Not on file    Non-medical: Not on file  Tobacco Use  . Smoking status: Never Smoker  . Smokeless tobacco: Never Used  Substance and Sexual Activity  . Alcohol use: No  . Drug use: No  . Sexual activity: Never  Lifestyle  . Physical activity:    Days per week: Not on file    Minutes per session: Not on file  . Stress: Not on file  Relationships  . Social connections:    Talks on phone: Not on file    Gets together: Not on file    Attends religious service: Not on file    Active member of club or organization: Not on file    Attends meetings of clubs or organizations: Not on file    Relationship status: Not on file  . Intimate partner violence:    Fear of current or ex partner: Not on file    Emotionally abused: Not on file    Physically abused: Not on file    Forced sexual activity: Not on file  Other Topics Concern  . Not on file  Social History Narrative  . Not on file    Past Surgical History:  Procedure Laterality Date  .  CHOLECYSTECTOMY      Family History  Problem Relation Age of Onset  . Hypertension Mother   . Mental illness Sister   . Migraines Sister     Allergies  Allergen Reactions  . Penicillins Hives    Current Outpatient Medications on File Prior to Visit  Medication Sig Dispense Refill  . albuterol (PROVENTIL HFA;VENTOLIN HFA) 108 (90 Base) MCG/ACT inhaler Inhale 2 puffs into the lungs every 6 (six) hours as needed for wheezing or shortness of breath. 1 Inhaler 3  . cetirizine (ZYRTEC) 10 MG tablet Take 1 tablet (10 mg total) by mouth daily. 90 tablet 3  . clindamycin (CLEOCIN) 150 MG capsule Take 1 capsule (150 mg total) by mouth 3 (three) times daily. 21 capsule 0  . cyclobenzaprine (FLEXERIL) 10 MG tablet Take 1 tablet (10 mg total) by mouth 2 (two) times daily as needed for muscle spasms. Use as needed for muscle pain 30 tablet 1  . doxycycline (VIBRA-TABS) 100 MG tablet Take 1 tablet (100 mg total) by mouth 2 (two) times daily. 20 tablet 0  .  naproxen sodium (ANAPROX) 220 MG tablet Take 220 mg by mouth 2 (two) times daily with a meal.    . phentermine 15 MG capsule Take 1 capsule (15 mg total) by mouth every morning. 30 capsule 2  . traMADol (ULTRAM) 50 MG tablet Take 1 tablet (50 mg total) by mouth every 8 (eight) hours as needed. 16 tablet 0   No current facility-administered medications on file prior to visit.     BP 134/85 (BP Location: Left Arm, Patient Position: Sitting, Cuff Size: Large)   Pulse (!) 110   Temp 98.3 F (36.8 C) (Oral)   Resp 16   Ht 5\' 3"  (1.6 m)   Wt 227 lb 3.2 oz (103.1 kg)   LMP 10/23/2017   SpO2 99%   BMI 40.25 kg/m    Objective:   Physical Exam  Constitutional: She appears well-developed and well-nourished.  HENT:  Head: Normocephalic and atraumatic.  Right Ear: Ear canal normal. Tympanic membrane is injected.  Left Ear: Tympanic membrane normal.  Mouth/Throat: No oropharyngeal exudate, posterior oropharyngeal edema or posterior  oropharyngeal erythema.  Cardiovascular: Normal rate, regular rhythm and normal heart sounds.  No murmur heard. Pulmonary/Chest: Effort normal and breath sounds normal. No respiratory distress. She has no wheezes.  Psychiatric: She has a normal mood and affect. Her behavior is normal. Judgment and thought content normal.          Assessment & Plan:  R otitis media- has a penicillin allergy. Rx with Zpak.  Pt is advised as follows:   Continue mucinex and sudafed as needed. You may also try delsym as needed for cough. Call if symptoms worsen or if not improved in 3-4 days.  Fibromyalgia- requesting refill on flexeril- refill provided.

## 2018-01-11 ENCOUNTER — Encounter: Payer: Self-pay | Admitting: Medical

## 2018-01-12 ENCOUNTER — Ambulatory Visit: Payer: 59 | Admitting: Medical

## 2018-01-31 NOTE — Progress Notes (Addendum)
[facility] [address], [address] [place], [place] [address] [phone]  Date:  02/01/2018   Name:  Jane Fletcher   DOB:  Nov 10, 1975   MRN:  [id number]  PCP:  Pearline Cables, MD    Chief Complaint: No chief complaint on file.   History of Present Illness:  Jane Fletcher is a 42 y.o. very pleasant female patient who presents with the following:  Pt has requested a CPE today History of FBM and obesity Pap: she is not sure, never had an abnormal that she can recal Labs: she is not fasting  Immun: tdap 2 years ago, UTD mammo due at 42 yo I last saw her about a year ago for a sick visit  She notes that her weight seems to fluctuate by 5- 10 lbs often. However she does not really weight herself on a regular basis on her home scale.  Encouraged her to try this and see how much fluctuation there really is  Her 50 yo daughter is doing great, they recently went on a family trip to [place] and had a great time  She does not smoke No significant alcohol She uses fertility awareness for contraception and has done so since her daughter was born   [place] Readings from Last 3 Encounters:  02/01/18 230 lb (104.3 kg)  11/16/17 227 lb 3.2 oz (103.1 kg)  06/13/17 224 lb 9.6 oz (101.9 kg)     Patient Active Problem List   Diagnosis Date Noted  . Fibromyalgia 12/30/2014  . Obesity 12/30/2014    Past Medical History:  Diagnosis Date  . Allergy   . Anxiety   . Fibromyalgia     Past Surgical History:  Procedure Laterality Date  . CHOLECYSTECTOMY      Social History   Tobacco Use  . Smoking status: Never Smoker  . Smokeless tobacco: Never Used  Substance Use Topics  . Alcohol use: No  . Drug use: No    Family History  Problem Relation Age of Onset  . Hypertension Mother   . Mental illness Sister   . Migraines Sister     Allergies  Allergen Reactions  . Penicillins Hives    Medication list has  been reviewed and updated.  Current Outpatient Medications on File Prior to Visit  Medication Sig Dispense Refill  . albuterol (PROVENTIL HFA;VENTOLIN HFA) 108 (90 Base) MCG/ACT inhaler Inhale 2 puffs into the lungs every 6 (six) hours as needed for wheezing or shortness of breath. 1 Inhaler 3  . cetirizine (ZYRTEC) 10 MG tablet Take 1 tablet (10 mg total) by mouth daily. 90 tablet 3  . cyclobenzaprine (FLEXERIL) 10 MG tablet Take 1 tablet (10 mg total) by mouth 2 (two) times daily as needed for muscle spasms. Use as needed for muscle pain 30 tablet 1  . naproxen sodium (ANAPROX) 220 MG tablet Take 220 mg by mouth 2 (two) times daily with a meal.     No current facility-administered medications on file prior to visit.     Review of Systems:  As per HPI- otherwise negative. Pulse Readings from Last 3 Encounters:  02/01/18 100  11/16/17 (!) 110  06/13/17 97    No vision change  She is trying to walk more but her left achilles is hurting her over the last several weeks- she was not sure if this was ok or not   Physical Examination: Vitals:   02/01/18 1455  BP: 130/86  Pulse: 100  Resp: 16  Temp: 99.5 F (37.5 C)  SpO2: 97%   Vitals:   02/01/18 1455  Weight: 230 lb (104.3 kg)  Height: 5\' 3"  (1.6 m)   Body mass index is 40.74 kg/m. Ideal Body Weight: Weight in (lb) to have BMI = 25: 140.8  GEN: WDWN, NAD, Non-toxic, A & O x 3, obese, looks well  HEENT: Atraumatic, Normocephalic. Neck supple. No masses, No LAD.  Bilateral TM wnl, oropharynx normal.  PEERL,EOMI.   Ears and Nose: No external deformity. CV: RRR, No M/G/R. No JVD. No thrill. No extra heart sounds. PULM: CTA B, no wheezes, crackles, rhonchi. No retractions. No resp. distress. No accessory muscle use. ABD: S, NT, ND. No rebound. No HSM. EXTR: No c/c/e NEURO Normal gait.  PSYCH: Normally interactive. Conversant. Not depressed or anxious appearing.  Calm demeanor.  Breast: normal exam, no masses/ dimpling/  discharge Pelvic: normal, no vaginal lesions or discharge. Uterus normal, no CMT, no adnexal tendereness or masses Left achilles is tender to palpation but not thickened and certainly not ruptured. No plantar fascia insertion pain    Assessment and Plan: Physical exam  Screening for hyperlipidemia - Plan: Lipid panel  Screening for diabetes mellitus - Plan: Comprehensive metabolic panel, Hemoglobin A1c  Screening for deficiency anemia - Plan: CBC  Screening for cervical cancer - Plan: Cytology - PAP  Screening for breast cancer  Left Achilles tendinitis - Plan: Ambulatory referral to Sports Medicine  Obesity without serious comorbidity, unspecified classification, unspecified obesity type  CPE today Labs pending as above Encouraged weight loss  Reminded to have mammo after her birthday and encouraged eye exam Referral to sports med Will plan further follow- up pending labs.   Signed Abbe Amsterdam, MD   Received her labs 6/20- message to pt Asked her to come in for fasting lipids in about 6 months  Results for orders placed or performed in visit on 02/01/18  CBC  Result Value Ref Range   WBC 6.0 4.0 - 10.5 K/uL   RBC 3.73 (L) 3.87 - 5.11 Mil/uL   Platelets 222.0 150.0 - 400.0 K/uL   Hemoglobin 12.0 12.0 - 15.0 g/dL   HCT 40.9 (L) 81.1 - 91.4 %   MCV 94.2 78.0 - 100.0 fl   MCHC 34.1 30.0 - 36.0 g/dL   RDW 78.2 95.6 - 21.3 %  Comprehensive metabolic panel  Result Value Ref Range   Sodium 142 135 - 145 mEq/L   Potassium 4.0 3.5 - 5.1 mEq/L   Chloride 104 96 - 112 mEq/L   CO2 28 19 - 32 mEq/L   Glucose, Bld 149 (H) 70 - 99 mg/dL   BUN 12 6 - 23 mg/dL   Creatinine, Ser 0.86 0.40 - 1.20 mg/dL   Total Bilirubin 0.4 0.2 - 1.2 mg/dL   Alkaline Phosphatase 49 39 - 117 U/L   AST 11 0 - 37 U/L   ALT 8 0 - 35 U/L   Total Protein 7.3 6.0 - 8.3 g/dL   Albumin 4.1 3.5 - 5.2 g/dL   Calcium 9.3 8.4 - 57.8 mg/dL   GFR 469.62 >95.28 mL/min  Hemoglobin A1c  Result Value  Ref Range   Hgb A1c MFr Bld 4.9 4.6 - 6.5 %  Lipid panel  Result Value Ref Range   Cholesterol 210 (H) 0 - 200 mg/dL   Triglycerides 413.2 (H) 0.0 - 149.0 mg/dL   HDL 44.01 >02.72 mg/dL   VLDL 53.6 (H) 0.0 -  40.0 mg/dL   Total CHOL/HDL Ratio 5    NonHDL 163.92   LDL cholesterol, direct  Result Value Ref Range   Direct LDL 129.0 mg/dL

## 2018-02-01 ENCOUNTER — Encounter: Payer: Self-pay | Admitting: Family Medicine

## 2018-02-01 ENCOUNTER — Ambulatory Visit (INDEPENDENT_AMBULATORY_CARE_PROVIDER_SITE_OTHER): Payer: 59 | Admitting: Family Medicine

## 2018-02-01 ENCOUNTER — Other Ambulatory Visit (HOSPITAL_COMMUNITY)
Admission: RE | Admit: 2018-02-01 | Discharge: 2018-02-01 | Disposition: A | Discharge status: Home / Self Care | Payer: 59 | Source: Ambulatory Visit | Attending: Family Medicine | Admitting: Family Medicine

## 2018-02-01 VITALS — BP 130/86 | HR 100 | Temp 99.5°F | Resp 16 | Ht 63.0 in | Wt 230.0 lb

## 2018-02-01 DIAGNOSIS — Z131 Encounter for screening for diabetes mellitus: Secondary | ICD-10-CM

## 2018-02-01 DIAGNOSIS — Z124 Encounter for screening for malignant neoplasm of cervix: Secondary | ICD-10-CM | POA: Diagnosis not present

## 2018-02-01 DIAGNOSIS — Z0000 Encounter for general adult medical examination without abnormal findings: Secondary | ICD-10-CM | POA: Diagnosis not present

## 2018-02-01 DIAGNOSIS — Z13220 Encounter for screening for lipoid disorders: Secondary | ICD-10-CM | POA: Diagnosis not present

## 2018-02-01 DIAGNOSIS — E669 Obesity, unspecified: Secondary | ICD-10-CM | POA: Diagnosis not present

## 2018-02-01 DIAGNOSIS — Z1231 Encounter for screening mammogram for malignant neoplasm of breast: Secondary | ICD-10-CM

## 2018-02-01 DIAGNOSIS — Z1239 Encounter for other screening for malignant neoplasm of breast: Secondary | ICD-10-CM

## 2018-02-01 DIAGNOSIS — Z130 Encounter for screening for diseases of the blood and blood-forming organs and certain disorders involving the immune mechanism: Secondary | ICD-10-CM | POA: Diagnosis not present

## 2018-02-01 DIAGNOSIS — M7662 Achilles tendinitis, left leg: Secondary | ICD-10-CM

## 2018-02-01 NOTE — Patient Instructions (Addendum)
Good to see you today!  I will be in touch with your pap and labs asap Don't forget to get a mammogram after your birthday! Please continue to work on diet, exercise and gradual weight loss  Health Maintenance, Female Adopting a healthy lifestyle and getting preventive care can go a long way to promote health and wellness. Talk with your health care provider about what schedule of regular examinations is right for you. This is a good chance for you to check in with your provider about disease prevention and staying healthy. In between checkups, there are plenty of things you can do on your own. Experts have done a lot of research about which lifestyle changes and preventive measures are most likely to keep you healthy. Ask your health care provider for more information. Weight and diet Eat a healthy diet  Be sure to include plenty of vegetables, fruits, low-fat dairy products, and lean protein.  Do not eat a lot of foods high in solid fats, added sugars, or salt.  Get regular exercise. This is one of the most important things you can do for your health. ? Most adults should exercise for at least 150 minutes each week. The exercise should increase your heart rate and make you sweat (moderate-intensity exercise). ? Most adults should also do strengthening exercises at least twice a week. This is in addition to the moderate-intensity exercise.  Maintain a healthy weight  Body mass index (BMI) is a measurement that can be used to identify possible weight problems. It estimates body fat based on height and weight. Your health care provider can help determine your BMI and help you achieve or maintain a healthy weight.  For females 51 years of age and older: ? A BMI below 18.5 is considered underweight. ? A BMI of 18.5 to 24.9 is normal. ? A BMI of 25 to 29.9 is considered overweight. ? A BMI of 30 and above is considered obese.  Watch levels of cholesterol and blood lipids  You should start  having your blood tested for lipids and cholesterol at 40 years of age, then have this test every 5 years.  You may need to have your cholesterol levels checked more often if: ? Your lipid or cholesterol levels are high. ? You are older than 40 years of age. ? You are at high risk for heart disease.  Cancer screening Lung Cancer  Lung cancer screening is recommended for adults 57-62 years old who are at high risk for lung cancer because of a history of smoking.  A yearly low-dose CT scan of the lungs is recommended for people who: ? Currently smoke. ? Have quit within the past 15 years. ? Have at least a 30-pack-year history of smoking. A pack year is smoking an average of one pack of cigarettes a day for 1 year.  Yearly screening should continue until it has been 15 years since you quit.  Yearly screening should stop if you develop a health problem that would prevent you from having lung cancer treatment.  Breast Cancer  Practice breast self-awareness. This means understanding how your breasts normally appear and feel.  It also means doing regular breast self-exams. Let your health care provider know about any changes, no matter how small.  If you are in your 20s or 30s, you should have a clinical breast exam (CBE) by a health care provider every 1-3 years as part of a regular health exam.  If you are 16 or older, have a  CBE every year. Also consider having a breast X-ray (mammogram) every year.  If you have a family history of breast cancer, talk to your health care provider about genetic screening.  If you are at high risk for breast cancer, talk to your health care provider about having an MRI and a mammogram every year.  Breast cancer gene (BRCA) assessment is recommended for women who have family members with BRCA-related cancers. BRCA-related cancers include: ? Breast. ? Ovarian. ? Tubal. ? Peritoneal cancers.  Results of the assessment will determine the need for  genetic counseling and BRCA1 and BRCA2 testing.  Cervical Cancer Your health care provider may recommend that you be screened regularly for cancer of the pelvic organs (ovaries, uterus, and vagina). This screening involves a pelvic examination, including checking for microscopic changes to the surface of your cervix (Pap test). You may be encouraged to have this screening done every 3 years, beginning at age 68.  For women ages 91-65, health care providers may recommend pelvic exams and Pap testing every 3 years, or they may recommend the Pap and pelvic exam, combined with testing for human papilloma virus (HPV), every 5 years. Some types of HPV increase your risk of cervical cancer. Testing for HPV may also be done on women of any age with unclear Pap test results.  Other health care providers may not recommend any screening for nonpregnant women who are considered low risk for pelvic cancer and who do not have symptoms. Ask your health care provider if a screening pelvic exam is right for you.  If you have had past treatment for cervical cancer or a condition that could lead to cancer, you need Pap tests and screening for cancer for at least 20 years after your treatment. If Pap tests have been discontinued, your risk factors (such as having a new sexual partner) need to be reassessed to determine if screening should resume. Some women have medical problems that increase the chance of getting cervical cancer. In these cases, your health care provider may recommend more frequent screening and Pap tests.  Colorectal Cancer  This type of cancer can be detected and often prevented.  Routine colorectal cancer screening usually begins at 40 years of age and continues through 40 years of age.  Your health care provider may recommend screening at an earlier age if you have risk factors for colon cancer.  Your health care provider may also recommend using home test kits to check for hidden blood in the  stool.  A small camera at the end of a tube can be used to examine your colon directly (sigmoidoscopy or colonoscopy). This is done to check for the earliest forms of colorectal cancer.  Routine screening usually begins at age 68.  Direct examination of the colon should be repeated every 5-10 years through 39 years of age. However, you may need to be screened more often if early forms of precancerous polyps or small growths are found.  Skin Cancer  Check your skin from head to toe regularly.  Tell your health care provider about any new moles or changes in moles, especially if there is a change in a mole's shape or color.  Also tell your health care provider if you have a mole that is larger than the size of a pencil eraser.  Always use sunscreen. Apply sunscreen liberally and repeatedly throughout the day.  Protect yourself by wearing long sleeves, pants, a wide-brimmed hat, and sunglasses whenever you are outside.  Heart disease,  diabetes, and high blood pressure  High blood pressure causes heart disease and increases the risk of stroke. High blood pressure is more likely to develop in: ? People who have blood pressure in the high end of the normal range (130-139/85-89 mm Hg). ? People who are overweight or obese. ? People who are African American.  If you are 35-41 years of age, have your blood pressure checked every 3-5 years. If you are 68 years of age or older, have your blood pressure checked every year. You should have your blood pressure measured twice-once when you are at a hospital or clinic, and once when you are not at a hospital or clinic. Record the average of the two measurements. To check your blood pressure when you are not at a hospital or clinic, you can use: ? An automated blood pressure machine at a pharmacy. ? A home blood pressure monitor.  If you are between 62 years and 22 years old, ask your health care provider if you should take aspirin to prevent  strokes.  Have regular diabetes screenings. This involves taking a blood sample to check your fasting blood sugar level. ? If you are at a normal weight and have a low risk for diabetes, have this test once every three years after 39 years of age. ? If you are overweight and have a high risk for diabetes, consider being tested at a younger age or more often. Preventing infection Hepatitis B  If you have a higher risk for hepatitis B, you should be screened for this virus. You are considered at high risk for hepatitis B if: ? You were born in a country where hepatitis B is common. Ask your health care provider which countries are considered high risk. ? Your parents were born in a high-risk country, and you have not been immunized against hepatitis B (hepatitis B vaccine). ? You have HIV or AIDS. ? You use needles to inject street drugs. ? You live with someone who has hepatitis B. ? You have had sex with someone who has hepatitis B. ? You get hemodialysis treatment. ? You take certain medicines for conditions, including cancer, organ transplantation, and autoimmune conditions.  Hepatitis C  Blood testing is recommended for: ? Everyone born from [birth year] through 1963. ? Anyone with known risk factors for hepatitis C.  Sexually transmitted infections (STIs)  You should be screened for sexually transmitted infections (STIs) including gonorrhea and chlamydia if: ? You are sexually active and are younger than 40 years of age. ? You are older than 40 years of age and your health care provider tells you that you are at risk for this type of infection. ? Your sexual activity has changed since you were last screened and you are at an increased risk for chlamydia or gonorrhea. Ask your health care provider if you are at risk.  If you do not have HIV, but are at risk, it Michaelis be recommended that you take a prescription medicine daily to prevent HIV infection. This is called pre-exposure prophylaxis  (PrEP). You are considered at risk if: ? You are sexually active and do not regularly use condoms or know the HIV status of your partner(s). ? You take drugs by injection. ? You are sexually active with a partner who has HIV.  Talk with your health care provider about whether you are at high risk of being infected with HIV. If you choose to begin PrEP, you should first be tested for HIV. You should  then be tested every 3 months for as long as you are taking PrEP. Pregnancy  If you are premenopausal and you may become pregnant, ask your health care provider about preconception counseling.  If you may become pregnant, take 400 to 800 micrograms (mcg) of folic acid every day.  If you want to prevent pregnancy, talk to your health care provider about birth control (contraception). Osteoporosis and menopause  Osteoporosis is a disease in which the bones lose minerals and strength with aging. This can result in serious bone fractures. Your risk for osteoporosis can be identified using a bone density scan.  If you are 7 years of age or older, or if you are at risk for osteoporosis and fractures, ask your health care provider if you should be screened.  Ask your health care provider whether you should take a calcium or vitamin D supplement to lower your risk for osteoporosis.  Menopause may have certain physical symptoms and risks.  Hormone replacement therapy may reduce some of these symptoms and risks. Talk to your health care provider about whether hormone replacement therapy is right for you. Follow these instructions at home:  Schedule regular health, dental, and eye exams.  Stay current with your immunizations.  Do not use any tobacco products including cigarettes, chewing tobacco, or electronic cigarettes.  If you are pregnant, do not drink alcohol.  If you are breastfeeding, limit how much and how often you drink alcohol.  Limit alcohol intake to no more than 1 drink per day for  nonpregnant women. One drink equals 12 ounces of beer, 5 ounces of wine, or 1 ounces of hard liquor.  Do not use street drugs.  Do not share needles.  Ask your health care provider for help if you need support or information about quitting drugs.  Tell your health care provider if you often feel depressed.  Tell your health care provider if you have ever been abused or do not feel safe at home. This information is not intended to replace advice given to you by your health care provider. Make sure you discuss any questions you have with your health care provider. Document Released: 02/15/2011 Document Revised: 01/08/2016 Document Reviewed: 05/06/2015 Elsevier Interactive Patient Education  [employer].

## 2018-02-02 ENCOUNTER — Encounter: Payer: Self-pay | Admitting: Family Medicine

## 2018-02-02 LAB — COMPREHENSIVE METABOLIC PANEL
ALBUMIN: 4.1 g/dL (ref 3.5–5.2)
ALK PHOS: 49 U/L (ref 39–117)
ALT: 8 U/L (ref 0–35)
AST: 11 U/L (ref 0–37)
BUN: 12 mg/dL (ref 6–23)
CHLORIDE: 104 meq/L (ref 96–112)
CO2: 28 mEq/L (ref 19–32)
CREATININE: 0.71 mg/dL (ref 0.40–1.20)
Calcium: 9.3 mg/dL (ref 8.4–10.5)
GFR: 117.33 mL/min (ref >60.00)
GLUCOSE: 149 mg/dL — AB (ref 70–99)
Potassium: 4 mEq/L (ref 3.5–5.1)
SODIUM: 142 meq/L (ref 135–145)
TOTAL PROTEIN: 7.3 g/dL (ref 6.0–8.3)
Total Bilirubin: 0.4 mg/dL (ref 0.2–1.2)

## 2018-02-02 LAB — CBC
HCT: 35.1 % — ABNORMAL LOW (ref 36.0–46.0)
Hemoglobin: 12 g/dL (ref 12.0–15.0)
MCHC: 34.1 g/dL (ref 30.0–36.0)
MCV: 94.2 fl (ref 78.0–100.0)
Platelets: 222 10*3/uL (ref 150.0–400.0)
RBC: 3.73 Mil/uL — AB (ref 3.87–5.11)
RDW: 13.2 % (ref 11.5–15.5)
WBC: 6 10*3/uL (ref 4.0–10.5)

## 2018-02-02 LAB — LIPID PANEL
Cholesterol: 210 mg/dL — ABNORMAL HIGH (ref 0–200)
HDL: 45.8 mg/dL (ref >39.00)
NONHDL: 163.92
Total CHOL/HDL Ratio: 5
Triglycerides: 210 mg/dL — ABNORMAL HIGH (ref 0.0–149.0)
VLDL: 42 mg/dL — AB (ref 0.0–40.0)

## 2018-02-02 LAB — LDL CHOLESTEROL, DIRECT: Direct LDL: 129 mg/dL

## 2018-02-02 LAB — HEMOGLOBIN A1C: HEMOGLOBIN A1C: 4.9 % (ref 4.6–6.5)

## 2018-02-03 LAB — CYTOLOGY - PAP
Diagnosis: NEGATIVE
HPV: NOT DETECTED

## 2018-02-04 ENCOUNTER — Encounter: Payer: Self-pay | Admitting: Family Medicine

## 2018-02-07 ENCOUNTER — Encounter: Payer: Self-pay | Admitting: Family Medicine

## 2018-02-07 ENCOUNTER — Ambulatory Visit: Payer: 59 | Admitting: Family Medicine

## 2018-02-07 DIAGNOSIS — M7662 Achilles tendinitis, left leg: Secondary | ICD-10-CM | POA: Diagnosis not present

## 2018-02-07 NOTE — Patient Instructions (Signed)
You have Achilles Tendinopathy Ibuprofen 600mg  three times a day with food OR aleve 2 tabs twice a day with food for pain and inflammation as needed. Calf raises 3 sets of 10 on level ground once a day first. When these are easy, can do them one legged 3 sets of 10. Finally advance to doing them on a step. Can add heel walks, toe walks forward and backward as well Ice bucket 10-15 minutes at end of day - can ice 3-4 times a day. Avoid uneven ground, hills as much as possible. Heel lifts in shoes or shoes with a natural heel lift - can use cam boot if this isn't giving you enough support. Consider physical therapy, orthotics, nitro patches if not improving as expected. Follow up in 6 weeks.

## 2018-02-08 ENCOUNTER — Encounter: Payer: Self-pay | Admitting: Family Medicine

## 2018-02-08 DIAGNOSIS — M7662 Achilles tendinitis, left leg: Secondary | ICD-10-CM | POA: Insufficient documentation

## 2018-02-08 NOTE — Assessment & Plan Note (Signed)
Patient's muscular skeletal ultrasound was reassuring without evidence of partial tearing.  She was instructed on home exercise program to do daily with stretches and exercises.  Icing, Aleve or ibuprofen.  Avoid uneven ground and hills.  Heel lifts provided.  She will consider physical therapy, orthotics, nitro patches if not improving as expected.  Follow-up in 6 weeks.

## 2018-02-08 NOTE — Progress Notes (Signed)
PCP and consultation requested by: Copland, Gwenlyn Found, MD  Subjective:   HPI: Patient is a 42 y.o. female here for left ankle pain.  Patient reports for about 3 months without an acute injury she has had posterior left ankle, Achilles pain. Pain is worse with walking and can get up to a 10 out of 10 level and sharp. Pain is currently 3 out of 10. She is tried naproxen and Flexeril for this without much benefit. No prior issues with this Achilles. No skin changes or numbness.  Past Medical History:  Diagnosis Date  . Allergy   . Anxiety   . Fibromyalgia     Current Outpatient Medications on File Prior to Visit  Medication Sig Dispense Refill  . albuterol (PROVENTIL HFA;VENTOLIN HFA) 108 (90 Base) MCG/ACT inhaler Inhale 2 puffs into the lungs every 6 (six) hours as needed for wheezing or shortness of breath. 1 Inhaler 3  . cetirizine (ZYRTEC) 10 MG tablet Take 1 tablet (10 mg total) by mouth daily. 90 tablet 3  . cyclobenzaprine (FLEXERIL) 10 MG tablet Take 1 tablet (10 mg total) by mouth 2 (two) times daily as needed for muscle spasms. Use as needed for muscle pain 30 tablet 1  . naproxen sodium (ANAPROX) 220 MG tablet Take 220 mg by mouth 2 (two) times daily with a meal.     No current facility-administered medications on file prior to visit.     Past Surgical History:  Procedure Laterality Date  . CHOLECYSTECTOMY      Allergies  Allergen Reactions  . Penicillins Hives    Social History   Socioeconomic History  . Marital status: Married    Spouse name: Not on file  . Number of children: Not on file  . Years of education: Not on file  . Highest education level: Not on file  Occupational History  . Not on file  Social Needs  . Financial resource strain: Not on file  . Food insecurity:    Worry: Not on file    Inability: Not on file  . Transportation needs:    Medical: Not on file    Non-medical: Not on file  Tobacco Use  . Smoking status: Never Smoker  .  Smokeless tobacco: Never Used  Substance and Sexual Activity  . Alcohol use: No  . Drug use: No  . Sexual activity: Never  Lifestyle  . Physical activity:    Days per week: Not on file    Minutes per session: Not on file  . Stress: Not on file  Relationships  . Social connections:    Talks on phone: Not on file    Gets together: Not on file    Attends religious service: Not on file    Active member of club or organization: Not on file    Attends meetings of clubs or organizations: Not on file    Relationship status: Not on file  . Intimate partner violence:    Fear of current or ex partner: Not on file    Emotionally abused: Not on file    Physically abused: Not on file    Forced sexual activity: Not on file  Other Topics Concern  . Not on file  Social History Narrative  . Not on file    Family History  Problem Relation Age of Onset  . Hypertension Mother   . Mental illness Sister   . Migraines Sister     BP (!) 132/97   Pulse (!) 103  Ht 5\' 3"  (1.6 m)   Wt 230 lb (104.3 kg)   LMP 01/10/2018   BMI 40.74 kg/m   Review of Systems: See HPI above.     Objective:  Physical Exam:  Gen: NAD, comfortable in exam room  Left ankle/foot: No gross deformity, swelling, ecchymoses FROM with 5/5 strength but pain on plantarflexion and full dorsiflexion TTP achilles about 2cm proximal to insertion.  No other tenderness. Negative ant drawer and talar tilt.   Negative syndesmotic compression. Negative calcaneal squeeze. Thompsons test negative. NV intact distally.  Right ankle/foot: No deformity. FROM with 5/5 strength. No tenderness to palpation. NVI distally.   Assessment & Plan:  1.  Left Achilles tendinopathy: Patient's muscular skeletal ultrasound was reassuring without evidence of partial tearing.  She was instructed on home exercise program to do daily with stretches and exercises.  Icing, Aleve or ibuprofen.  Avoid uneven ground and hills.  Heel lifts  provided.  She will consider physical therapy, orthotics, nitro patches if not improving as expected.  Follow-up in 6 weeks.

## 2018-03-16 ENCOUNTER — Encounter: Payer: Self-pay | Admitting: Family Medicine

## 2018-03-16 ENCOUNTER — Ambulatory Visit: Payer: 59 | Admitting: Family Medicine

## 2018-03-16 VITALS — BP 126/76 | HR 106 | Temp 98.3°F | Resp 16 | Ht 63.0 in | Wt 230.0 lb

## 2018-03-16 DIAGNOSIS — R0683 Snoring: Secondary | ICD-10-CM | POA: Diagnosis not present

## 2018-03-16 DIAGNOSIS — R05 Cough: Secondary | ICD-10-CM

## 2018-03-16 DIAGNOSIS — R059 Cough, unspecified: Secondary | ICD-10-CM

## 2018-03-16 DIAGNOSIS — M797 Fibromyalgia: Secondary | ICD-10-CM | POA: Diagnosis not present

## 2018-03-16 MED ORDER — CYCLOBENZAPRINE HCL 10 MG PO TABS: 10.0000 mg | ORAL_TABLET | Freq: Two times a day (BID) | ORAL | 1 refills | Status: DC | PRN

## 2018-03-16 MED ORDER — PREDNISONE 20 MG PO TABS: ORAL_TABLET | ORAL | 0 refills | Status: DC

## 2018-03-16 MED ORDER — ALBUTEROL SULFATE (2.5 MG/3ML) 0.083% IN NEBU
2.5000 mg | INHALATION_SOLUTION | Freq: Once | RESPIRATORY_TRACT | Status: AC
  Administered 2018-03-16: 2.5 mg via RESPIRATORY_TRACT

## 2018-03-16 MED ORDER — ALBUTEROL SULFATE HFA 108 (90 BASE) MCG/ACT IN AERS: 2.0000 | INHALATION_SPRAY | Freq: Four times a day (QID) | RESPIRATORY_TRACT | 0 refills | Status: DC | PRN

## 2018-03-16 MED ORDER — DOXYCYCLINE HYCLATE 100 MG PO CAPS
100.0000 mg | ORAL_CAPSULE | Freq: Two times a day (BID) | ORAL | 0 refills | Status: DC
Start: 2018-03-16 — End: 2018-05-04

## 2018-03-16 NOTE — Progress Notes (Signed)
[facility] [address], [address] [place], [place] [id number] [phone] [phone]  Date:  03/16/2018   Name:  Jane MessickBennita Wonce Nomura   DOB:  02/15/1976   MRN:  [id number]  PCP:  Pearline Cablesopland, Tamieka Rancourt C, MD    Chief Complaint: Bronchitis (huband dx with bronchitis, chest tightness, coughing, non productive, hoarsness, fever, fatigue)   History of Present Illness:  Jane Fletcher is a 42 y.o. very pleasant female patient who presents with the following:  Here today with illness- her husband was dx with bronchitis today and she thinks she has the same She has not been feeling well since yesterday This am she noted a cough, and also sinus pressure and congestion She is not sure if any fever- she thinks that she may have had a fever last night but she took some tylenol No vomiting or diarrhea Her husband has been ill for about 5 days Mild ST No earache  She is [place] to [place] this weekend for her [place]- she is turning 42 tomorrow!  Her husband was given abx and an inhaler this morning by his doc   Her husband has noted that she is snoring and stopping breathing at night- ? OSA She would like to be tested Also needs a refill of her flexeril that she uses prn for her FBM  She is typically a bit tachycardic - looking back over several notes this is not unusual for her  She feels that her chest is slightly tight and wheezy, but no distress or CP She does not smoke, does not use hormones, no history of DVT or PE   Patient Active Problem List   Diagnosis Date Noted  . Left Achilles tendinitis 02/08/2018  . Fibromyalgia 12/30/2014  . Obesity 12/30/2014    Past Medical History:  Diagnosis Date  . Allergy   . Anxiety   . Fibromyalgia     Past Surgical History:  Procedure Laterality Date  . CHOLECYSTECTOMY      Social History   Tobacco Use  . Smoking status: Never Smoker  . Smokeless tobacco: Never Used  Substance Use  Topics  . Alcohol use: No  . Drug use: No    Family History  Problem Relation Age of Onset  . Hypertension Mother   . Mental illness Sister   . Migraines Sister     Allergies  Allergen Reactions  . Penicillins Hives    Medication list has been reviewed and updated.  Current Outpatient Medications on File Prior to Visit  Medication Sig Dispense Refill  . cetirizine (ZYRTEC) 10 MG tablet Take 1 tablet (10 mg total) by mouth daily. 90 tablet 3  . cyclobenzaprine (FLEXERIL) 10 MG tablet Take 1 tablet (10 mg total) by mouth 2 (two) times daily as needed for muscle spasms. Use as needed for muscle pain 30 tablet 1  . naproxen sodium (ANAPROX) 220 MG tablet Take 220 mg by mouth 2 (two) times daily with a meal.     No current facility-administered medications on file prior to visit.     Review of Systems:  As per HPI- otherwise negative.   Physical Examination: Vitals:   03/16/18 1630  BP: 126/76  Pulse: (!) 115  Resp: 16  Temp: 98.3 F (36.8 C)  SpO2: 98%   Vitals:   03/16/18 1630  Weight: 230 lb (104.3 kg)  Height: 5\' 3"  (1.6 m)   Body mass index is 40.74 kg/m. Ideal Body  Weight: Weight in (lb) to have BMI = 25: 140.8  GEN: WDWN, NAD, Non-toxic, A & O x 3, looks well, obese  Coughing in room today HEENT: Atraumatic, Normocephalic. Neck supple. No masses, No LAD.  Bilateral TM wnl, oropharynx normal.  PEERL,EOMI.   Ears and Nose: No external deformity. CV: RRR, No M/G/R. No JVD. No thrill. No extra heart sounds. PULM: CTA B, no wheezes, crackles, rhonchi. No retractions. No resp. distress. No accessory muscle use. EXTR: No c/c/e NEURO Normal gait.  No calf swelling or tenderness  PSYCH: Normally interactive. Conversant. Not depressed or anxious appearing.  Calm demeanor.   Pulse Readings from Last 3 Encounters:  03/16/18 (!) 115  02/07/18 (!) 103  02/01/18 100   Given an albuterol neb which did help her feel more open   Assessment and Plan: Cough -  Plan: albuterol (PROVENTIL) (2.5 MG/3ML) 0.083% nebulizer solution 2.5 mg, albuterol (PROVENTIL HFA;VENTOLIN HFA) 108 (90 Base) MCG/ACT inhaler, doxycycline (VIBRAMYCIN) 100 MG capsule, predniSONE (DELTASONE) 20 MG tablet  Snoring - Plan: Ambulatory referral to Pulmonology  Fibromyalgia - Plan: cyclobenzaprine (FLEXERIL) 10 MG tablet  Here today with cough and chest tightness.  She reports a history of "asthma" with illness several years ago She is leaving on a trip to celebrate turning 42 tomorrow!   Will treat with albuterol HFA, 3 days of pred and doxycycline She will let me know if not feeling better in a few days and will seek care if getting worse   Signed Abbe Amsterdam, MD

## 2018-03-16 NOTE — Patient Instructions (Signed)
Good to see you today- we are going to treat you with albuterol inhaler, doxycycline antiboitic and an inhaler as needed If you are not getting better in a few days please alert me- if getting worse please seek care right away  We will set you up with pulmonology for a sleep study

## 2018-03-21 ENCOUNTER — Ambulatory Visit: Payer: 59 | Admitting: Family Medicine

## 2018-05-04 ENCOUNTER — Ambulatory Visit (INDEPENDENT_AMBULATORY_CARE_PROVIDER_SITE_OTHER): Payer: 59 | Admitting: Pulmonary Disease

## 2018-05-04 ENCOUNTER — Encounter: Payer: Self-pay | Admitting: Pulmonary Disease

## 2018-05-04 VITALS — BP 118/78 | HR 97 | Ht 63.0 in | Wt 229.0 lb

## 2018-05-04 DIAGNOSIS — Z6841 Body Mass Index (BMI) 40.0 and over, adult: Secondary | ICD-10-CM

## 2018-05-04 DIAGNOSIS — Z23 Encounter for immunization: Secondary | ICD-10-CM

## 2018-05-04 DIAGNOSIS — R0683 Snoring: Secondary | ICD-10-CM | POA: Diagnosis not present

## 2018-05-04 NOTE — Patient Instructions (Signed)
Flu shot today  Will arrange for home sleep study Will call to arrange for follow up after sleep study reviewed  

## 2018-05-04 NOTE — Progress Notes (Signed)
[facility]  Chief Complaint  Patient presents with  . sleep consult    Pt referred by Dr. Patsy Lageropland MD. Pt wakes up having hard time breathing, and gasping for air. Pt is not rested after full night of sleep in last 49mo. Pt is requesting flu shot today.    Constitutional: BP 118/78 (BP Location: Left Arm, Cuff Size: Normal)   Pulse 97   Ht 5\' 3"  (1.6 m)   Wt 229 lb (103.9 kg)   SpO2 96%   BMI 40.57 kg/m   History of Present Illness: Jane Fletcher is a 42 y.o. female with snoring.  She has noticed feeling more tired during the day.  This has been getting worse over the past 6 months.  She snores and wakes up feeling choked.  Her family says she stops breathing while asleep.  She has trouble staying focused at work, and will have to take take naps on weekends.  She goes to sleep at 11 pm.  She falls asleep in 20 minutes.  She wakes up some times to use the bathroom.  She gets out of bed at 730 am.  She feels tired in the morning.  She denies morning headache.  She does not use anything to help her fall sleep or stay awake.  She had trouble with TMJ, but didn't have to use a mouthguard.   She denies sleep walking, sleep talking, bruxism, or nightmares.  There is no history of restless legs.  She denies sleep hallucinations, sleep paralysis, or cataplexy.  The Epworth score is 2 out of 24.   Comprehensive Respiratory Exam:  Appearance - well kempt  ENMT - nasal mucosa moist, turbinates clear, midline nasal septum, no dental lesions, no gingival bleeding, no oral exudates, no tonsillar hypertrophy, MP 4, high arched palate, enlarged tongue Neck - no masses, trachea midline, no thyromegaly, no elevation in JVP Respiratory - normal appearance of chest wall, normal respiratory effort w/o accessory muscle use, no dullness on percussion, no wheezing or rales CV - s1s2 regular rate and rhythm, no murmurs, no peripheral edema, radial pulses  symmetric GI - soft, non tender, no masses, no hepatosplenomegaly Lymph - no adenopathy noted in neck and axillary areas MSK - normal muscle strength and tone, normal gait Ext - no cyanosis, clubbing, or joint inflammation noted Skin - no rashes, lesions, or ulcers Neuro - oriented to person, place, and time Psych - normal mood and affect  CXR from 04/28/16 (reviewed by me) > normal   CMP Latest Ref Rng & Units 02/01/2018 04/28/2016 08/31/2015  Glucose 70 - 99 mg/dL 272(Z149(H) 366(Y103(H) 403(K115(H)  BUN 6 - 23 mg/dL 12 13 15   Creatinine 0.40 - 1.20 mg/dL 7.420.71 5.950.65 6.380.60  Sodium 135 - 145 mEq/L 142 141 140  Potassium 3.5 - 5.1 mEq/L 4.0 3.9 3.9  Chloride 96 - 112 mEq/L 104 108 107  CO2 19 - 32 mEq/L 28 27 24   Calcium 8.4 - 10.5 mg/dL 9.3 9.2 8.9  Total Protein 6.0 - 8.3 g/dL 7.3 - 7.9  Total Bilirubin 0.2 - 1.2 mg/dL 0.4 - 0.5  Alkaline Phos 39 - 117 U/L 49 - 50  AST 0 - 37 U/L 11 - 22  ALT 0 - 35 U/L 8 - 14    CBC Latest Ref Rng & Units 02/01/2018 06/13/2017 04/28/2016  WBC 4.0 - 10.5 K/uL 6.0 7.2 4.8  Hemoglobin 12.0 - 15.0 g/dL 75.612.0 43.312.3 29.512.2  Hematocrit 36.0 - 46.0 %  35.1(L) 36.7 35.5(L)  Platelets 150.0 - 400.0 K/uL 222.0 230.0 219      Discussion: She has snoring, sleep disruption, witnessed apnea and daytime sleepiness.  She has history of anxiety and fibromyalgia.  Her BMI is > 40.  CO2 on recent CMET is up.  I am concerned she could have sleep apnea.     Assessment/Plan:  Snoring with excessive daytime sleepiness. - will need to arrange for a home sleep study  Obesity. - discussed how weight can impact sleep and risk for sleep disordered breathing - discussed options to assist with weight loss: combination of diet modification, cardiovascular and strength training exercises  Cardiovascular risk. - had an extensive discussion regarding the adverse health consequences related to untreated sleep disordered breathing - specifically discussed the risks for hypertension, coronary  artery disease, cardiac dysrhythmias, cerebrovascular disease, and diabetes - lifestyle modification discussed  Safe driving practices. - discussed how sleep disruption can increase risk of accidents, particularly when driving - safe driving practices were discussed  Therapies for obstructive sleep apnea. - if the sleep study shows significant sleep apnea, then various therapies for treatment were reviewed: CPAP, oral appliance, and surgical interventions    Patient Instructions  Flu shot today Will arrange for home sleep study Will call to arrange for follow up after sleep study reviewed     Coralyn Helling, MD [facility] 05/04/2018, 9:52 AM  Flow Sheet  Sleep tests:  Review of Systems: Constitutional: Negative for fever and unexpected weight change.  HENT: Positive for sinus pressure. Negative for congestion, dental problem, ear pain, nosebleeds, postnasal drip, rhinorrhea, sneezing, sore throat and trouble swallowing.   Eyes: Negative for redness and itching.  Respiratory: Negative for cough, chest tightness, shortness of breath and wheezing.   Cardiovascular: Negative for palpitations and leg swelling.  Gastrointestinal: Negative for nausea and vomiting.  Genitourinary: Negative for dysuria.  Musculoskeletal: Negative for joint swelling.  Skin: Negative for rash.  Allergic/Immunologic: Positive for environmental allergies. Negative for food allergies and immunocompromised state.  Neurological: Negative for headaches.  Hematological: Does not bruise/bleed easily.  Psychiatric/Behavioral: Negative for dysphoric mood. The patient is not nervous/anxious.    Past Medical History: She  has a past medical history of Allergy, Anxiety, and Fibromyalgia.  Past Surgical History: She  has a past surgical history that includes Cholecystectomy.  Family History: Her family history includes Hypertension in her mother; Mental illness in her sister; Migraines in her  sister.  Social History: She  reports that she has never smoked. She has never used smokeless tobacco. She reports that she does not drink alcohol or use drugs.  Medications: Allergies as of 05/04/2018      Reactions   Penicillins Hives      Medication List        Accurate as of 05/04/18  9:52 AM. Always use your most recent med list.          albuterol 108 (90 Base) MCG/ACT inhaler Commonly known as:  PROVENTIL HFA;VENTOLIN HFA Inhale 2 puffs into the lungs every 6 (six) hours as needed for wheezing or shortness of breath.   cetirizine 10 MG tablet Commonly known as:  ZYRTEC Take 1 tablet (10 mg total) by mouth daily.   cyclobenzaprine 10 MG tablet Commonly known as:  FLEXERIL Take 1 tablet (10 mg total) by mouth 2 (two) times daily as needed for muscle spasms. Use as needed for muscle pain   naproxen sodium 220 MG tablet Commonly known as:  ALEVE  Take 220 mg by mouth 2 (two) times daily with a meal.

## 2018-05-04 NOTE — Progress Notes (Signed)
   Subjective:    Patient ID: Jane Fletcher, female    DOB: 07/04/1976, 42 y.o.   MRN: [id number]  HPI    Review of Systems  Constitutional: Negative for fever and unexpected weight change.  HENT: Positive for sinus pressure. Negative for congestion, dental problem, ear pain, nosebleeds, postnasal drip, rhinorrhea, sneezing, sore throat and trouble swallowing.   Eyes: Negative for redness and itching.  Respiratory: Negative for cough, chest tightness, shortness of breath and wheezing.   Cardiovascular: Negative for palpitations and leg swelling.  Gastrointestinal: Negative for nausea and vomiting.  Genitourinary: Negative for dysuria.  Musculoskeletal: Negative for joint swelling.  Skin: Negative for rash.  Allergic/Immunologic: Positive for environmental allergies. Negative for food allergies and immunocompromised state.  Neurological: Negative for headaches.  Hematological: Does not bruise/bleed easily.  Psychiatric/Behavioral: Negative for dysphoric mood. The patient is not nervous/anxious.        Objective:   Physical Exam        Assessment & Plan:

## 2018-05-27 DIAGNOSIS — G4733 Obstructive sleep apnea (adult) (pediatric): Secondary | ICD-10-CM | POA: Diagnosis not present

## 2018-05-31 ENCOUNTER — Other Ambulatory Visit: Payer: Self-pay | Admitting: *Deleted

## 2018-05-31 DIAGNOSIS — G4733 Obstructive sleep apnea (adult) (pediatric): Secondary | ICD-10-CM | POA: Diagnosis not present

## 2018-05-31 DIAGNOSIS — R0683 Snoring: Secondary | ICD-10-CM

## 2018-06-01 ENCOUNTER — Telehealth: Payer: Self-pay

## 2018-06-01 DIAGNOSIS — G4733 Obstructive sleep apnea (adult) (pediatric): Secondary | ICD-10-CM

## 2018-06-01 NOTE — Telephone Encounter (Signed)
Dr. Wynona Neat has reviewed the home sleep test this showed Moderate Sleep Apnea and Moderate Oxygen Desaturations.   Recommendations   Treatment options are CPAP with the settings auto 5 to 15.    Weight loss measures .   Advise against driving while sleepy & against medication with sedative side effects.    Make appointment for 8 to 10 weeks for compliance with download with Dr. Wynona Neat.   LMTCB

## 2018-06-02 NOTE — Telephone Encounter (Signed)
Pt is calling back [phone]

## 2018-06-02 NOTE — Telephone Encounter (Signed)
Called and spoke with pt letting her know the results of the HST. Pt expressed understanding. I placed the order for pt to begin cpap and scheduled a follow up appt for her to see Dr. Wynona Neat for the follow up after cpap.  Nothing further needed.

## 2018-06-09 NOTE — Telephone Encounter (Signed)
Apt changed spoke with the patient nothing further needed at this time.

## 2018-06-09 NOTE — Telephone Encounter (Signed)
Needing to reschedule apt for the end off 08/2018. atc will call back

## 2018-07-24 ENCOUNTER — Ambulatory Visit: Payer: 59 | Admitting: Pulmonary Disease

## 2018-09-01 ENCOUNTER — Ambulatory Visit: Payer: 59 | Admitting: Pulmonary Disease

## 2018-09-15 ENCOUNTER — Ambulatory Visit: Payer: 59 | Admitting: Pulmonary Disease

## 2018-10-09 ENCOUNTER — Ambulatory Visit: Payer: 59 | Admitting: Nurse Practitioner

## 2018-10-09 ENCOUNTER — Encounter: Payer: Self-pay | Admitting: Nurse Practitioner

## 2018-10-09 VITALS — BP 120/88 | HR 92 | Temp 98.5°F | Ht 63.0 in | Wt 233.4 lb

## 2018-10-09 DIAGNOSIS — J0100 Acute maxillary sinusitis, unspecified: Secondary | ICD-10-CM

## 2018-10-09 MED ORDER — AZITHROMYCIN 250 MG PO TABS: 250.0000 mg | ORAL_TABLET | Freq: Every day | ORAL | 0 refills | Status: DC

## 2018-10-09 MED ORDER — SALINE SPRAY 0.65 % NA SOLN: 1.0000 | NASAL | 0 refills | Status: DC | PRN

## 2018-10-09 MED ORDER — FLUTICASONE PROPIONATE 50 MCG/ACT NA SUSP: 2.0000 | Freq: Every day | NASAL | 0 refills | Status: DC

## 2018-10-09 NOTE — Progress Notes (Signed)
Subjective:  Patient ID: Jane Fletcher, female    DOB: July 06, 1976  Age: 43 y.o. MRN: [id number]  CC: Itchy Eye (left eye swollen, woke up crusted , right eye is itching/ 1day/ also having sinus issues/ needs rx refill on flexeril)  Eye Problem   Both eyes are affected.This is a new problem. The current episode started today. The problem occurs intermittently. The problem has been resolved. There was no injury mechanism. The pain is at a severity of 0/10. The patient is experiencing no pain. There is no known exposure to pink eye. She does not wear contacts. Associated symptoms include eye redness, itching and a recent URI. Pertinent negatives include no blurred vision, eye discharge, foreign body sensation, photophobia or vomiting. She has tried nothing for the symptoms.  Sinusitis  This is a new problem. The current episode started 1 to 4 weeks ago. The problem has been waxing and waning since onset. There has been no fever. Associated symptoms include congestion, coughing, headaches, sinus pressure and sneezing. Pertinent negatives include no chills, diaphoresis, ear pain, hoarse voice, neck pain, shortness of breath, sore throat or swollen glands. Treatments tried: antihistamine. The treatment provided no relief.  sinus congestion x 31month. No improvement with OTC medication. . Reviewed past Medical, Social and Family history today.  Outpatient Medications Prior to Visit  Medication Sig Dispense Refill  . cetirizine (ZYRTEC) 10 MG tablet Take 1 tablet (10 mg total) by mouth daily. 90 tablet 3  . cyclobenzaprine (FLEXERIL) 10 MG tablet Take 1 tablet (10 mg total) by mouth 2 (two) times daily as needed for muscle spasms. Use as needed for muscle pain 30 tablet 1  . naproxen sodium (ANAPROX) 220 MG tablet Take 220 mg by mouth 2 (two) times daily with a meal.    . albuterol (PROVENTIL HFA;VENTOLIN HFA) 108 (90 Base) MCG/ACT inhaler Inhale 2 puffs into the lungs every 6 (six) hours as needed  for wheezing or shortness of breath. (Patient not taking: Reported on 05/04/2018) 1 Inhaler 0   No facility-administered medications prior to visit.     ROS See HPI  Objective:  BP 120/88   Pulse 92   Temp 98.5 F (36.9 C) (Oral)   Ht 5\' 3"  (1.6 m)   Wt 233 lb 6.4 oz (105.9 kg)   SpO2 96%   BMI 41.34 kg/m   BP Readings from Last 3 Encounters:  10/09/18 120/88  05/04/18 118/78  03/16/18 126/76    Wt Readings from Last 3 Encounters:  10/09/18 233 lb 6.4 oz (105.9 kg)  05/04/18 229 lb (103.9 kg)  03/16/18 230 lb (104.3 kg)    Physical Exam Vitals signs reviewed.  HENT:     Head:     Jaw: No trismus.     Right Ear: Ear canal and external ear normal. A middle ear effusion is present. No mastoid tenderness. Tympanic membrane is not injected or erythematous.     Left Ear: Ear canal and external ear normal. A middle ear effusion is present. No mastoid tenderness. Tympanic membrane is not injected or erythematous.     Nose: Mucosal edema and rhinorrhea present.     Right Sinus: Maxillary sinus tenderness present. No frontal sinus tenderness.     Left Sinus: Maxillary sinus tenderness present. No frontal sinus tenderness.     Mouth/Throat:     Pharynx: Uvula midline. Posterior oropharyngeal erythema present. No oropharyngeal exudate.  Eyes:     General: No scleral icterus.    Extraocular Movements:  Extraocular movements intact.     Conjunctiva/sclera: Conjunctivae normal.     Pupils: Pupils are equal, round, and reactive to light.  Neck:     Musculoskeletal: Normal range of motion and neck supple.  Cardiovascular:     Rate and Rhythm: Normal rate.  Pulmonary:     Effort: Pulmonary effort is normal.     Breath sounds: Normal breath sounds.  Lymphadenopathy:     Cervical: No cervical adenopathy.  Neurological:     Mental Status: She is alert and oriented to person, place, and time.     Lab Results  Component Value Date   WBC 6.0 02/01/2018   HGB 12.0 02/01/2018    HCT 35.1 (L) 02/01/2018   PLT 222.0 02/01/2018   GLUCOSE 149 (H) 02/01/2018   CHOL 210 (H) 02/01/2018   TRIG 210.0 (H) 02/01/2018   HDL 45.80 02/01/2018   LDLDIRECT 129.0 02/01/2018   ALT 8 02/01/2018   AST 11 02/01/2018   NA 142 02/01/2018   K 4.0 02/01/2018   CL 104 02/01/2018   CREATININE 0.71 02/01/2018   BUN 12 02/01/2018   CO2 28 02/01/2018   HGBA1C 4.9 02/01/2018    Assessment & Plan:   Nery was seen today for itchy eye.  Diagnoses and all orders for this visit:  Acute non-recurrent maxillary sinusitis -     azithromycin (ZITHROMAX Z-PAK) 250 MG tablet; Take 1 tablet (250 mg total) by mouth daily. Take 2tabs on first day, then 1tab once a day till complete -     sodium chloride (OCEAN) 0.65 % SOLN nasal spray; Place 1 spray into both nostrils as needed for congestion. -     fluticasone (FLONASE) 50 MCG/ACT nasal spray; Place 2 sprays into both nostrils daily.   I am having [name] start on azithromycin, sodium chloride, and fluticasone. I am also having her maintain her naproxen sodium, cetirizine, albuterol, and cyclobenzaprine.  Meds ordered this encounter  Medications  . azithromycin (ZITHROMAX Z-PAK) 250 MG tablet    Sig: Take 1 tablet (250 mg total) by mouth daily. Take 2tabs on first day, then 1tab once a day till complete    Dispense:  6 tablet    Refill:  0    Order Specific Question:   Supervising Provider    Answer:   Dianne Dun [[id number]]  . sodium chloride (OCEAN) 0.65 % SOLN nasal spray    Sig: Place 1 spray into both nostrils as needed for congestion.    Dispense:  15 mL    Refill:  0    Order Specific Question:   Supervising Provider    Answer:   Dianne Dun [[id number]]  . fluticasone (FLONASE) 50 MCG/ACT nasal spray    Sig: Place 2 sprays into both nostrils daily.    Dispense:  16 g    Refill:  0    Order Specific Question:   Supervising Provider    Answer:   Dianne Dun [[id number]]    Problem List Items Addressed This Visit     None    Visit Diagnoses    Acute non-recurrent maxillary sinusitis    -  Primary   Relevant Medications   azithromycin (ZITHROMAX Z-PAK) 250 MG tablet   sodium chloride (OCEAN) 0.65 % SOLN nasal spray   fluticasone (FLONASE) 50 MCG/ACT nasal spray       Follow-up: Return if symptoms worsen or fail to improve.  Alysia Penna, NP

## 2018-10-09 NOTE — Patient Instructions (Signed)
Eye symptoms are due to sinus congestion.  May switch from cetirizine to allegra or Xyzal or Claritin, 1tab once a day.  Please request flexeril refill from pcp.   Sinusitis, Adult Sinusitis is inflammation of your sinuses. Sinuses are hollow spaces in the bones around your face. Your sinuses are located:  Around your eyes.  In the middle of your forehead.  Behind your nose.  In your cheekbones. Mucus normally drains out of your sinuses. When your nasal tissues become inflamed or swollen, mucus can become trapped or blocked. This allows bacteria, viruses, and fungi to grow, which leads to infection. Most infections of the sinuses are caused by a virus. Sinusitis can develop quickly. It can last for up to 4 weeks (acute) or for more than 12 weeks (chronic). Sinusitis often develops after a cold. What are the causes? This condition is caused by anything that creates swelling in the sinuses or stops mucus from draining. This includes:  Allergies.  Asthma.  Infection from bacteria or viruses.  Deformities or blockages in your nose or sinuses.  Abnormal growths in the nose (nasal polyps).  Pollutants, such as chemicals or irritants in the air.  Infection from fungi (rare). What increases the risk? You are more likely to develop this condition if you:  Have a weak body defense system (immune system).  Do a lot of swimming or diving.  Overuse nasal sprays.  Smoke. What are the signs or symptoms? The main symptoms of this condition are pain and a feeling of pressure around the affected sinuses. Other symptoms include:  Stuffy nose or congestion.  Thick drainage from your nose.  Swelling and warmth over the affected sinuses.  Headache.  Upper toothache.  A cough that may get worse at night.  Extra mucus that collects in the throat or the back of the nose (postnasal drip).  Decreased sense of smell and taste.  Fatigue.  A fever.  Sore throat.  Bad  breath. How is this diagnosed? This condition is diagnosed based on:  Your symptoms.  Your medical history.  A physical exam.  Tests to find out if your condition is acute or chronic. This may include: ? Checking your nose for nasal polyps. ? Viewing your sinuses using a device that has a light (endoscope). ? Testing for allergies or bacteria. ? Imaging tests, such as an MRI or CT scan. In rare cases, a bone biopsy may be done to rule out more serious types of fungal sinus disease. How is this treated? Treatment for sinusitis depends on the cause and whether your condition is chronic or acute.  If caused by a virus, your symptoms should go away on their own within 10 days. You may be given medicines to relieve symptoms. They include: ? Medicines that shrink swollen nasal passages (topical intranasal decongestants). ? Medicines that treat allergies (antihistamines). ? A spray that eases inflammation of the nostrils (topical intranasal corticosteroids). ? Rinses that help get rid of thick mucus in your nose (nasal saline washes).  If caused by bacteria, your health care provider may recommend waiting to see if your symptoms improve. Most bacterial infections will get better without antibiotic medicine. You may be given antibiotics if you have: ? A severe infection. ? A weak immune system.  If caused by narrow nasal passages or nasal polyps, you may need to have surgery. Follow these instructions at home: Medicines  Take, use, or apply over-the-counter and prescription medicines only as told by your health care provider. These  may include nasal sprays.  If you were prescribed an antibiotic medicine, take it as told by your health care provider. Do not stop taking the antibiotic even if you start to feel better. Hydrate and humidify   Drink enough fluid to keep your urine pale yellow. Staying hydrated will help to thin your mucus.  Use a cool mist humidifier to keep the humidity  level in your home above 50%.  Inhale steam for 10-15 minutes, 3-4 times a day, or as told by your health care provider. You can do this in the bathroom while a hot shower is running.  Limit your exposure to cool or dry air. Rest  Rest as much as possible.  Sleep with your head raised (elevated).  Make sure you get enough sleep each night. General instructions   Apply a warm, moist washcloth to your face 3-4 times a day or as told by your health care provider. This will help with discomfort.  Wash your hands often with soap and water to reduce your exposure to germs. If soap and water are not available, use hand sanitizer.  Do not smoke. Avoid being around people who are smoking (secondhand smoke).  Keep all follow-up visits as told by your health care provider. This is important. Contact a health care provider if:  You have a fever.  Your symptoms get worse.  Your symptoms do not improve within 10 days. Get help right away if:  You have a severe headache.  You have persistent vomiting.  You have severe pain or swelling around your face or eyes.  You have vision problems.  You develop confusion.  Your neck is stiff.  You have trouble breathing. Summary  Sinusitis is soreness and inflammation of your sinuses. Sinuses are hollow spaces in the bones around your face.  This condition is caused by nasal tissues that become inflamed or swollen. The swelling traps or blocks the flow of mucus. This allows bacteria, viruses, and fungi to grow, which leads to infection.  If you were prescribed an antibiotic medicine, take it as told by your health care provider. Do not stop taking the antibiotic even if you start to feel better.  Keep all follow-up visits as told by your health care provider. This is important. This information is not intended to replace advice given to you by your health care provider. Make sure you discuss any questions you have with your health care  provider. Document Released: 08/02/2005 Document Revised: 01/02/2018 Document Reviewed: 01/02/2018 Elsevier Interactive Patient Education  2019 [employer].

## 2019-01-15 ENCOUNTER — Ambulatory Visit (INDEPENDENT_AMBULATORY_CARE_PROVIDER_SITE_OTHER): Payer: 59 | Admitting: Family Medicine

## 2019-01-15 ENCOUNTER — Other Ambulatory Visit: Payer: Self-pay | Admitting: Family Medicine

## 2019-01-15 ENCOUNTER — Other Ambulatory Visit: Payer: Self-pay

## 2019-01-15 ENCOUNTER — Emergency Department (HOSPITAL_BASED_OUTPATIENT_CLINIC_OR_DEPARTMENT_OTHER): Payer: 59

## 2019-01-15 ENCOUNTER — Encounter (HOSPITAL_BASED_OUTPATIENT_CLINIC_OR_DEPARTMENT_OTHER): Payer: Self-pay | Admitting: *Deleted

## 2019-01-15 ENCOUNTER — Emergency Department (HOSPITAL_BASED_OUTPATIENT_CLINIC_OR_DEPARTMENT_OTHER)
Admission: EM | Admit: 2019-01-15 | Discharge: 2019-01-15 | Disposition: A | Discharge status: Home / Self Care | Payer: 59 | Attending: Emergency Medicine | Admitting: Emergency Medicine

## 2019-01-15 ENCOUNTER — Encounter: Payer: Self-pay | Admitting: Family Medicine

## 2019-01-15 DIAGNOSIS — Z1159 Encounter for screening for other viral diseases: Secondary | ICD-10-CM | POA: Diagnosis not present

## 2019-01-15 DIAGNOSIS — R509 Fever, unspecified: Secondary | ICD-10-CM | POA: Insufficient documentation

## 2019-01-15 DIAGNOSIS — M791 Myalgia, unspecified site: Secondary | ICD-10-CM | POA: Diagnosis not present

## 2019-01-15 DIAGNOSIS — M797 Fibromyalgia: Secondary | ICD-10-CM

## 2019-01-15 DIAGNOSIS — R5381 Other malaise: Secondary | ICD-10-CM

## 2019-01-15 DIAGNOSIS — Z20822 Contact with and (suspected) exposure to covid-19: Secondary | ICD-10-CM

## 2019-01-15 DIAGNOSIS — Z79899 Other long term (current) drug therapy: Secondary | ICD-10-CM | POA: Diagnosis not present

## 2019-01-15 LAB — SARS CORONAVIRUS 2 AG (30 MIN TAT): SARS Coronavirus 2 Ag: NEGATIVE

## 2019-01-15 MED ORDER — CYCLOBENZAPRINE HCL 10 MG PO TABS: 10.0000 mg | ORAL_TABLET | Freq: Two times a day (BID) | ORAL | 0 refills | Status: DC | PRN

## 2019-01-15 MED ORDER — ACETAMINOPHEN 325 MG PO TABS
650.0000 mg | ORAL_TABLET | Freq: Once | ORAL | Status: AC
  Administered 2019-01-15: 650 mg via ORAL
  Filled 2019-01-15: qty 2

## 2019-01-15 NOTE — Patient Instructions (Addendum)
Please stay at home until we get your COVID test back from your job  If you are still sick and your COVID test is negative we might want to re-test you- let me know in that case!

## 2019-01-15 NOTE — ED Notes (Signed)
Pt. Reports since last Thursday she has felt run down with loss of appetite and generalized body aches.  Pt. Reports fever since Thurs. And getting better and now back.  Pt. Reports no diarrhea and just nausea.

## 2019-01-15 NOTE — Progress Notes (Signed)
[facility] [address] [place], [place] [address] [phone] [phone]  Date:  01/15/2019   Name:  Jane CirriBennita Fletcher   DOB:  11/04/1975   MRN:  [id number]  PCP:  Pearline Cablesopland, Fizza Scales C, MD    Chief Complaint: No chief complaint on file.   History of Present Illness:  Jane Fletcher is a 43 y.o. very pleasant female patient who presents with the following:  Virtual visit today for concern of recent illness Patient location is home Provider location is office Patient identity confirmed with 2 factors, she consents to virtual visit today  I last saw this patient in June 2019 for a physical History of fibromyalgia and obesity She has a 41-year-old daughter  Today is Monday-  This past Thursday she got sick- she had body aches, fatigue, temp to 101 on Friday She has also noted nausea, no vomiting.  No diarrhea No real cough, no sore throat No abd pain No UTI sx Decreased appetite   Today she just continues to feel tired and achy.  However she has no respiratory distress. Last fever was early Saturday am  She actually did get tested for COVID on Friday through her job, results are pending She will get her result on Wednesday we think  She is staying home from work right now until her results come in Her daughter is doing well- she has not been sick  Her husband is also okay She would like some more of her flexeril to use for body aches, she tried it and it was helpful  She is not pregnant   Patient Active Problem List   Diagnosis Date Noted  . Left Achilles tendinitis 02/08/2018  . Fibromyalgia 12/30/2014  . Obesity 12/30/2014    Past Medical History:  Diagnosis Date  . Allergy   . Anxiety   . Fibromyalgia     Past Surgical History:  Procedure Laterality Date  . CHOLECYSTECTOMY      Social History   Tobacco Use  . Smoking status: Never Smoker  . Smokeless tobacco: Never Used  Substance Use Topics  .  Alcohol use: No  . Drug use: No    Family History  Problem Relation Age of Onset  . Hypertension Mother   . Mental illness Sister   . Migraines Sister     Allergies  Allergen Reactions  . Penicillins Hives    Medication list has been reviewed and updated.  Current Outpatient Medications on File Prior to Visit  Medication Sig Dispense Refill  . albuterol (PROVENTIL HFA;VENTOLIN HFA) 108 (90 Base) MCG/ACT inhaler Inhale 2 puffs into the lungs every 6 (six) hours as needed for wheezing or shortness of breath. (Patient not taking: Reported on 05/04/2018) 1 Inhaler 0  . azithromycin (ZITHROMAX Z-PAK) 250 MG tablet Take 1 tablet (250 mg total) by mouth daily. Take 2tabs on first day, then 1tab once a day till complete 6 tablet 0  . cetirizine (ZYRTEC) 10 MG tablet Take 1 tablet (10 mg total) by mouth daily. 90 tablet 3  . cyclobenzaprine (FLEXERIL) 10 MG tablet Take 1 tablet (10 mg total) by mouth 2 (two) times daily as needed for muscle spasms. Use as needed for muscle pain 30 tablet 1  . fluticasone (FLONASE) 50 MCG/ACT nasal spray Place 2 sprays into both nostrils daily. 16 g 0  . naproxen sodium (ANAPROX) 220 MG tablet Take 220 mg by mouth 2 (two) times daily with a meal.    .  sodium chloride (OCEAN) 0.65 % SOLN nasal spray Place 1 spray into both nostrils as needed for congestion. 15 mL 0   No current facility-administered medications on file prior to visit.     Review of Systems:  As per HPI- otherwise negative.   Physical Examination: There were no vitals filed for this visit. There were no vitals filed for this visit. There is no height or weight on file to calculate BMI. Ideal Body Weight:    Pt observed on video today-she looks well.  No cough, wheezing, or distress is noted She is not checking her vitals at home   Assessment and Plan: Malaise  Fibromyalgia - Plan: cyclobenzaprine (FLEXERIL) 10 MG tablet  Virtual visit today for concern of illness.  She started to  feel bad at the end of last week, actually happened to get a COVID-19 test at work.  We anticipate getting this result next 1 to 2 days.  She has had fever and body aches, no other specific symptoms noted.  She is in no distress and is overall doing okay, she just was not sure what to do next.  She will let me know what her COVID test results show.  A negative test might be a false negative, in that case we might retest her now that she is further into her illness. I did refill her Flexeril, but warned her not to use it if she is feeling short of breath or tired. Advised her to seek emergency care if she has any significant shortness of breath or any distress  We will Signed Abbe Amsterdam, MD

## 2019-01-15 NOTE — ED Provider Notes (Signed)
[facility] EMERGENCY DEPARTMENT Provider Note   CSN: [id number] Arrival date & time: 01/15/19  1854    History   Chief Complaint Chief Complaint  Patient presents with  . Fever  . Generalized Body Aches    HPI Dow AdolphBennita Pittsley is a 43 y.o. female.     The history is provided by the patient.  Fever  Max temp prior to arrival:  100 Temp source:  Oral Severity:  Moderate Onset quality:  Sudden Duration:  5 days Timing:  Constant Progression:  Worsening Chronicity:  New Relieved by:  Nothing Worsened by:  Exertion Ineffective treatments:  None tried Associated symptoms: chills, cough, headaches and myalgias   Associated symptoms: no chest pain, no diarrhea, no dysuria and no nausea   Associated symptoms comment:  Feelings of shortness of breath and wheezing today.  Patient was tested for COVID at her work on Friday but results will not return until Tuesday or Wednesday. 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   Past Medical History:  Diagnosis Date  . Allergy   . Anxiety   . Fibromyalgia     Patient Active Problem List   Diagnosis Date Noted  . Left Achilles tendinitis 02/08/2018  . Fibromyalgia 12/30/2014  . Obesity 12/30/2014    Past Surgical History:  Procedure Laterality Date  . CHOLECYSTECTOMY       OB History   No obstetric history on file.      Home Medications    Prior to Admission medications   Medication Sig Start Date End Date Taking? Authorizing Provider  cetirizine (ZYRTEC) 10 MG tablet Take 1 tablet (10 mg total) by mouth daily. 06/13/17  Yes Saguier, Ramon DredgeEdward, PA-C  cyclobenzaprine (FLEXERIL) 10 MG tablet Take 1 tablet (10 mg total) by mouth 2 (two) times daily as needed for muscle spasms. Use as needed for muscle pain 01/15/19  Yes Copland, Gwenlyn FoundJessica C, MD  naproxen sodium (ANAPROX) 220 MG tablet Take 220 mg by mouth 2 (two) times daily with a  meal.   Yes [provider]  albuterol (PROVENTIL HFA;VENTOLIN HFA) 108 (90 Base) MCG/ACT inhaler Inhale 2 puffs into the lungs every 6 (six) hours as needed for wheezing or shortness of breath. Patient not taking: Reported on 05/04/2018 03/16/18   Copland, Gwenlyn FoundJessica C, MD  azithromycin (ZITHROMAX Z-PAK) 250 MG tablet Take 1 tablet (250 mg total) by mouth daily. Take 2tabs on first day, then 1tab once a day till complete 10/09/18   Nche, Bonna Gainsharlotte Lum, NP  fluticasone (FLONASE) 50 MCG/ACT nasal spray Place 2 sprays into both nostrils daily. 10/09/18   Nche, Bonna Gainsharlotte Lum, NP  sodium chloride (OCEAN) 0.65 % SOLN nasal spray Place 1 spray into both nostrils as needed for congestion. 10/09/18   Nche, Bonna Gainsharlotte Lum, NP    Family History Family History  Problem Relation Age of Onset  . Hypertension Mother   . Mental illness Sister   . Migraines Sister     Social History Social History   Tobacco Use  . Smoking status: Never Smoker  . Smokeless tobacco: Never Used  Substance Use Topics  . Alcohol use: No  . Drug use: No     Allergies   Penicillins   Review of Systems Review of Systems  Constitutional: Positive for chills and fever.  Respiratory: Positive for cough.   Cardiovascular: Negative for chest pain.  Gastrointestinal: Negative for diarrhea and nausea.  Genitourinary: Negative  for dysuria.  Musculoskeletal: Positive for myalgias.  Neurological: Positive for headaches.  All other systems reviewed and are negative.    Physical Exam Updated Vital Signs BP 125/79   Pulse (!) 123   Temp (!) 100.9 F (38.3 C) (Oral)   Resp 20   Ht  (1.6 m)   Wt 101.2 kg   LMP 01/07/2019   SpO2 97%   BMI 39.52 kg/m   Physical Exam Vitals signs and nursing note reviewed.  Constitutional:      General: She is not in acute distress.    Appearance: She is well-developed.  HENT:     Head: Normocephalic and atraumatic.     Right Ear: Tympanic membrane normal.     Left Ear:  Tympanic membrane normal.  Eyes:     Pupils: Pupils are equal, round, and reactive to light.  Cardiovascular:     Rate and Rhythm: Regular rhythm. Tachycardia present.     Heart sounds: Normal heart sounds. No murmur. No friction rub.  Pulmonary:     Effort: Pulmonary effort is normal.     Breath sounds: Normal breath sounds. No wheezing or rales.  Abdominal:     General: Bowel sounds are normal. There is no distension.     Palpations: Abdomen is soft.     Tenderness: There is no abdominal tenderness. There is no guarding or rebound.  Musculoskeletal: Normal range of motion.        General: No tenderness.     Comments: No edema  Skin:    General: Skin is warm and dry.     Findings: No rash.  Neurological:     General: No focal deficit present.     Mental Status: She is alert and oriented to person, place, and time. Mental status is at baseline.     Cranial Nerves: No cranial nerve deficit.  Psychiatric:        Mood and Affect: Mood normal.        Behavior: Behavior normal.        Thought Content: Thought content normal.      ED Treatments / Results  Labs (all labs ordered are listed, but only abnormal results are displayed) Labs Reviewed  SARS CORONAVIRUS 2 (HOSP ORDER, PERFORMED IN [facility] [facility] VIA ABBOTT ID)    EKG None  Radiology Dg Chest Port 1 View  Result Date: 01/15/2019 CLINICAL DATA:  Fever, muscle aches and slight cough. EXAM: PORTABLE CHEST 1 VIEW COMPARISON:  Chest x-ray 04/28/2006 FINDINGS: The cardiac silhouette, mediastinal and hilar contours are within normal limits. Streaky bibasilar atelectasis and small pleural effusions. No discrete infiltrates or edema. The bony thorax is intact. IMPRESSION: Streaky bibasilar atelectasis and small bilateral pleural effusions. Electronically Signed   By: Rudie Meyer M.D.   On: 01/15/2019 20:01    Procedures Procedures (including critical care time)  Medications Ordered in ED Medications  acetaminophen  (TYLENOL) tablet 650 mg (has no administration in time range)     Initial Impression / Assessment and Plan / ED Course  I have reviewed the triage vital signs and the nursing notes.  Pertinent labs & imaging results that were available during my care of the patient were reviewed by me and considered in my medical decision making (see chart for details).        Sharine Hoff was evaluated in Emergency Department on 01/15/2019 for the symptoms described in the history of present illness. She was evaluated in the context of the global  COVID-19 pandemic, which necessitated consideration that the patient might be at risk for infection with the SARS-CoV-2 virus that causes COVID-19. Institutional protocols and algorithms that pertain to the evaluation of patients at risk for COVID-19 are in a state of rapid change based on information released by regulatory bodies including the CDC and federal and state organizations. These policies and algorithms were followed during the patient's care in the ED.  8:30 PM X-ray with some streaky atelectasis some may be small bilateral pleural effusions but not greatest inspiratory film.  Patient's heart rate has improved to 110.  Oxygen saturation has remained in the mid to high 90s with respiratory rate in the 20s.  At this time feel that patient is safe for discharge home.  Recommended to follow-up on her COVID testing she had on Friday and if it is negative talk with her PCP about being retested.  Final Clinical Impressions(s) / ED Diagnoses   Final diagnoses:  Suspected Covid-19 Virus Infection    ED Discharge Orders    None       Gwyneth Sprout, MD 01/15/19 2031

## 2019-01-15 NOTE — ED Triage Notes (Signed)
She was tested for Covid 4 days ago after having a fever, muscle aches, and slight cough. She works with homeless people.

## 2019-01-18 ENCOUNTER — Encounter: Payer: Self-pay | Admitting: Family Medicine

## 2019-01-24 ENCOUNTER — Telehealth: Payer: 59 | Admitting: Family

## 2019-01-24 DIAGNOSIS — R399 Unspecified symptoms and signs involving the genitourinary system: Secondary | ICD-10-CM | POA: Diagnosis not present

## 2019-01-24 MED ORDER — NITROFURANTOIN MONOHYD MACRO 100 MG PO CAPS: 100.0000 mg | ORAL_CAPSULE | Freq: Two times a day (BID) | ORAL | 0 refills | Status: DC

## 2019-01-24 NOTE — Progress Notes (Signed)
We are sorry that you are not feeling well.  Here is how we plan to help!  Based on what you shared with me it looks like you most likely have a simple urinary tract infection.  A UTI (Urinary Tract Infection) is a bacterial infection of the bladder.  Most cases of urinary tract infections are simple to treat but a key part of your care is to encourage you to drink plenty of fluids and watch your symptoms carefully.  Approximately 5 minutes was spent documenting and reviewing patient's chart.    I have prescribed MacroBid 100 mg twice a day for 5 days.  Your symptoms should gradually improve. Call us if the burning in your urine worsens, you develop worsening fever, back pain or pelvic pain or if your symptoms do not resolve after completing the antibiotic.  Urinary tract infections can be prevented by drinking plenty of water to keep your body hydrated.  Also be sure when you wipe, wipe from front to back and don't hold it in!  If possible, empty your bladder every 4 hours.  Your e-visit answers were reviewed by a board certified advanced clinical practitioner to complete your personal care plan.  Depending on the condition, your plan could have included both over the counter or prescription medications.  If there is a problem please reply  once you have received a response from your provider.  Your safety is important to [place].  If you have drug allergies check your prescription carefully.    You can use MyChart to ask questions about today's visit, request a non-urgent call back, or ask for a work or school excuse for 24 hours related to this e-Visit. If it has been greater than 24 hours you will need to follow up with your provider, or enter a new e-Visit to address those concerns.   You will get an e-mail in the next two days asking about your experience.  I hope that your e-visit has been valuable and will speed your recovery. Thank you for using e-visits.

## 2019-02-08 ENCOUNTER — Telehealth: Payer: 59 | Admitting: Family

## 2019-02-08 DIAGNOSIS — A499 Bacterial infection, unspecified: Secondary | ICD-10-CM

## 2019-02-08 NOTE — Progress Notes (Signed)
Based on what you shared with me, I feel your condition warrants further evaluation and I recommend that you be seen for a face to face office visit.  NOTE: If you entered your credit card information for this eVisit, you will not be charged. You may see a "hold" on your card for the $35 but that hold will drop off and you will not have a charge processed.  If you are having a true medical emergency please call 911.     For an urgent face to face visit, [facility] has five urgent care centers for your convenience:    [email or web address] to reserve your spot online an avoid wait times  [facility] [address], [address] [place], [place] [address] Modified hours of operation: Monday-Friday, 12 PM to 6 PM  Closed Saturday & Sunday  *Across the street from [employer]  [facility] (New Address!) [address], [address] [place], [place] [address] *Just off [address], across the road from [employer]* Modified hours of operation: Monday-Friday, 12 PM to 6 PM  Closed Saturday & Sunday   The following sites will take your insurance:  . [facility]    [phone]                  Get Driving Directions  [address] [place], [place] [address] . 10 am to 8 pm Monday-Friday . 12 pm to 8 pm Saturday-Sunday   . [facility]  [phone]                  Get Driving Directions  [address], [address] [place], [place] [address] . 8 am to 8 pm Monday-Friday . 9 am to 6 pm Saturday . 11 am to 6 pm Sunday   . [facility]  [phone]                  Get Driving Directions   [address] [place],  [address] . 8 am to 8 pm Monday-Friday . 8 am to 4 pm Saturday-Sunday    . [facility] [place]                    Get Driving Directions  [phone]  [address] [place],  [address]   . Monday-Friday, 12 PM to 6 PM    Your e-visit answers were reviewed by a board certified advanced clinical practitioner to complete your personal care plan.  Thank you for using e-Visits. 

## 2019-02-09 ENCOUNTER — Encounter: Payer: Self-pay | Admitting: Family Medicine

## 2019-02-09 ENCOUNTER — Other Ambulatory Visit: Payer: Self-pay

## 2019-02-09 ENCOUNTER — Ambulatory Visit: Payer: 59 | Admitting: Family Medicine

## 2019-02-09 VITALS — BP 136/88 | HR 112 | Temp 99.4°F | Resp 16 | Ht 63.0 in | Wt 219.0 lb

## 2019-02-09 DIAGNOSIS — M797 Fibromyalgia: Secondary | ICD-10-CM

## 2019-02-09 DIAGNOSIS — R300 Dysuria: Secondary | ICD-10-CM

## 2019-02-09 DIAGNOSIS — N390 Urinary tract infection, site not specified: Secondary | ICD-10-CM | POA: Diagnosis not present

## 2019-02-09 LAB — POC URINALSYSI DIPSTICK (AUTOMATED)
Bilirubin, UA: NEGATIVE
Glucose, UA: NEGATIVE
Ketones, UA: NEGATIVE
Nitrite, UA: POSITIVE
Protein, UA: POSITIVE — AB
Spec Grav, UA: 1.015 (ref 1.010–1.025)
Urobilinogen, UA: 0.2 E.U./dL
pH, UA: 6 (ref 5.0–8.0)

## 2019-02-09 MED ORDER — CYCLOBENZAPRINE HCL 10 MG PO TABS: 10.0000 mg | ORAL_TABLET | Freq: Two times a day (BID) | ORAL | 0 refills | Status: DC | PRN

## 2019-02-09 MED ORDER — CIPROFLOXACIN HCL 250 MG PO TABS: 250.0000 mg | ORAL_TABLET | Freq: Two times a day (BID) | ORAL | 0 refills | Status: DC

## 2019-02-09 NOTE — Progress Notes (Signed)
Patient ID: Dawayne CirriBennita Fletcher, female    DOB: 02/07/1976  Age: 43 y.o. MRN: [id number]    Subjective:  Subjective  HPI Jane Fletcher presents for cloudy urine / odor,  +dysuria x last few days.  No fever. No back pain   Review of Systems  Constitutional: Negative for activity change, appetite change, chills and fever.  Gastrointestinal: Negative for abdominal distention and abdominal pain.  Genitourinary: Positive for dysuria, frequency and urgency. Negative for difficulty urinating, dyspareunia, flank pain, genital sores, hematuria, menstrual problem, pelvic pain, vaginal discharge and vaginal pain.  Musculoskeletal: Negative for back pain.    History Past Medical History:  Diagnosis Date  . Allergy   . Anxiety   . Fibromyalgia     She has a past surgical history that includes Cholecystectomy.   Her family history includes Hypertension in her mother; Mental illness in her sister; Migraines in her sister.She reports that she has never smoked. She has never used smokeless tobacco. She reports that she does not drink alcohol or use drugs.  Current Outpatient Medications on File Prior to Visit  Medication Sig Dispense Refill  . albuterol (PROVENTIL HFA;VENTOLIN HFA) 108 (90 Base) MCG/ACT inhaler Inhale 2 puffs into the lungs every 6 (six) hours as needed for wheezing or shortness of breath. (Patient not taking: Reported on 05/04/2018) 1 Inhaler 0  . azithromycin (ZITHROMAX Z-PAK) 250 MG tablet Take 1 tablet (250 mg total) by mouth daily. Take 2tabs on first day, then 1tab once a day till complete 6 tablet 0  . cetirizine (ZYRTEC) 10 MG tablet Take 1 tablet (10 mg total) by mouth daily. 90 tablet 3  . fluticasone (FLONASE) 50 MCG/ACT nasal spray Place 2 sprays into both nostrils daily. 16 g 0  . naproxen sodium (ANAPROX) 220 MG tablet Take 220 mg by mouth 2 (two) times daily with a meal.    . sodium chloride (OCEAN) 0.65 % SOLN nasal spray Place 1 spray into both nostrils as needed for  congestion. 15 mL 0   No current facility-administered medications on file prior to visit.      Objective:  Objective  Physical Exam Constitutional:      Appearance: She is well-developed.  HENT:     Head: Normocephalic and atraumatic.  Eyes:     Conjunctiva/sclera: Conjunctivae normal.  Neck:     Musculoskeletal: Normal range of motion and neck supple.     Thyroid: No thyromegaly.     Vascular: No carotid bruit or JVD.  Cardiovascular:     Rate and Rhythm: Normal rate and regular rhythm.     Heart sounds: Normal heart sounds. No murmur.  Pulmonary:     Effort: Pulmonary effort is normal. No respiratory distress.     Breath sounds: Normal breath sounds. No wheezing or rales.  Chest:     Chest wall: No tenderness.  Neurological:     Mental Status: She is alert and oriented to person, place, and time.    BP 136/88 (BP Location: Right Arm, Patient Position: Sitting, Cuff Size: Large)   Pulse (!) 112   Temp 99.4 F (37.4 C) (Oral)   Resp 16   Ht 5\' 3"  (1.6 m)   Wt 219 lb (99.3 kg)   SpO2 98%   BMI 38.79 kg/m  Wt Readings from Last 3 Encounters:  02/09/19 219 lb (99.3 kg)  01/15/19 223 lb 2 oz (101.2 kg)  10/09/18 233 lb 6.4 oz (105.9 kg)     Lab Results  Component Value  Date   WBC 6.0 02/01/2018   HGB 12.0 02/01/2018   HCT 35.1 (L) 02/01/2018   PLT 222.0 02/01/2018   GLUCOSE 149 (H) 02/01/2018   CHOL 210 (H) 02/01/2018   TRIG 210.0 (H) 02/01/2018   HDL 45.80 02/01/2018   LDLDIRECT 129.0 02/01/2018   ALT 8 02/01/2018   AST 11 02/01/2018   NA 142 02/01/2018   K 4.0 02/01/2018   CL 104 02/01/2018   CREATININE 0.71 02/01/2018   BUN 12 02/01/2018   CO2 28 02/01/2018   HGBA1C 4.9 02/01/2018    Dg Chest Port 1 View  Result Date: 01/15/2019 CLINICAL DATA:  Fever, muscle aches and slight cough. EXAM: PORTABLE CHEST 1 VIEW COMPARISON:  Chest x-ray 04/28/2006 FINDINGS: The cardiac silhouette, mediastinal and hilar contours are within normal limits. Streaky  bibasilar atelectasis and small pleural effusions. No discrete infiltrates or edema. The bony thorax is intact. IMPRESSION: Streaky bibasilar atelectasis and small bilateral pleural effusions. Electronically Signed   By: Marijo Sanes M.D.   On: 01/15/2019 20:01     Assessment & Plan:  Plan  I have discontinued Jane Fletcher's nitrofurantoin (macrocrystal-monohydrate). I am also having her start on ciprofloxacin. Additionally, I am having her maintain her naproxen sodium, cetirizine, albuterol, azithromycin, sodium chloride, fluticasone, and cyclobenzaprine.  Meds ordered this encounter  Medications  . ciprofloxacin (CIPRO) 250 MG tablet    Sig: Take 1 tablet (250 mg total) by mouth 2 (two) times daily.    Dispense:  6 tablet    Refill:  0  . cyclobenzaprine (FLEXERIL) 10 MG tablet    Sig: Take 1 tablet (10 mg total) by mouth 2 (two) times daily as needed for muscle spasms. Use as needed for muscle pain    Dispense:  30 tablet    Refill:  0    Problem List Items Addressed This Visit      Unprioritized   Fibromyalgia   Relevant Medications   cyclobenzaprine (FLEXERIL) 10 MG tablet    Other Visit Diagnoses    Dysuria    -  Primary   Relevant Orders   POCT Urinalysis Dipstick (Automated) (Completed)   Urine Culture   Urinary tract infection without hematuria, site unspecified       Relevant Medications   ciprofloxacin (CIPRO) 250 MG tablet      Follow-up: Return in about 2 weeks (around 02/23/2019), or if symptoms worsen or fail to improve, for lab visit in 2 weeks to recheck UA.  Ann Held, DO

## 2019-02-09 NOTE — Patient Instructions (Signed)

## 2019-02-11 ENCOUNTER — Encounter: Payer: Self-pay | Admitting: Family Medicine

## 2019-02-11 LAB — URINE CULTURE
MICRO NUMBER:: 611923
SPECIMEN QUALITY:: ADEQUATE

## 2019-02-19 ENCOUNTER — Encounter: Payer: Self-pay | Admitting: Family Medicine

## 2019-02-19 DIAGNOSIS — N390 Urinary tract infection, site not specified: Secondary | ICD-10-CM

## 2019-02-20 ENCOUNTER — Other Ambulatory Visit: Payer: Self-pay

## 2019-02-20 ENCOUNTER — Other Ambulatory Visit: Payer: 59

## 2019-02-20 DIAGNOSIS — N390 Urinary tract infection, site not specified: Secondary | ICD-10-CM

## 2019-02-22 ENCOUNTER — Telehealth: Payer: Self-pay | Admitting: Family Medicine

## 2019-02-22 ENCOUNTER — Encounter: Payer: Self-pay | Admitting: Family Medicine

## 2019-02-22 LAB — URINE CULTURE
MICRO NUMBER:: 641469
SPECIMEN QUALITY:: ADEQUATE

## 2019-02-22 MED ORDER — NITROFURANTOIN MONOHYD MACRO 100 MG PO CAPS: 100.0000 mg | ORAL_CAPSULE | Freq: Two times a day (BID) | ORAL | 0 refills | Status: DC

## 2019-02-22 NOTE — Telephone Encounter (Signed)
Received her urine culture- called and LMOM that I am sending in abx  rx for macrobid    Results for orders placed or performed in visit on 02/20/19  Urine Culture   Specimen: Urine  Result Value Ref Range   MICRO NUMBER: [id number]    SPECIMEN QUALITY: Adequate    Sample Source NOT GIVEN    STATUS: FINAL    ISOLATE 1: Escherichia coli (A)       Susceptibility   Escherichia coli - URINE CULTURE, REFLEX    AMOX/CLAVULANIC 8 Sensitive     AMPICILLIN >=32 Resistant     AMPICILLIN/SULBACTAM >=32 Resistant     CEFAZOLIN* <=4 Not Reportable      * For infections other than uncomplicated UTIcaused by E. coli, K. pneumoniae or P. mirabilis:Cefazolin is resistant if MIC > or = 8 mcg/mL.(Distinguishing susceptible versus intermediatefor isolates with MIC < or = 4 mcg/mL requiresadditional testing.)For uncomplicated UTI caused by E. coli,K. pneumoniae or P. mirabilis: Cefazolin issusceptible if MIC <32 mcg/mL and predictssusceptible to the oral agents cefaclor, cefdinir,cefpodoxime, cefprozil, cefuroxime, cephalexinand loracarbef.    CEFEPIME <=1 Sensitive     CEFTRIAXONE <=1 Sensitive     CIPROFLOXACIN <=0.25 Sensitive     LEVOFLOXACIN 1 Intermediate     ERTAPENEM <=0.5 Sensitive     GENTAMICIN >=16 Resistant     IMIPENEM <=0.25 Sensitive     NITROFURANTOIN <=16 Sensitive     PIP/TAZO <=4 Sensitive     TOBRAMYCIN 8 Intermediate     TRIMETH/SULFA* >=320 Resistant      * For infections other than uncomplicated UTIcaused by E. coli, K. pneumoniae or P. mirabilis:Cefazolin is resistant if MIC > or = 8 mcg/mL.(Distinguishing susceptible versus intermediatefor isolates with MIC < or = 4 mcg/mL requiresadditional testing.)For uncomplicated UTI caused by E. coli,K. pneumoniae or P. mirabilis: Cefazolin issusceptible if MIC <32 mcg/mL and predictssusceptible to the oral agents cefaclor, cefdinir,cefpodoxime, cefprozil, cefuroxime, cephalexinand loracarbef.Legend:S = Susceptible  I = IntermediateR =  Resistant  NS = Not susceptible* = Not tested  NR = Not reported**NN = See antimicrobic comments   Called pt-

## 2019-02-23 ENCOUNTER — Other Ambulatory Visit: Payer: 59

## 2019-02-23 ENCOUNTER — Other Ambulatory Visit: Payer: Self-pay | Admitting: Internal Medicine

## 2019-03-01 LAB — NOVEL CORONAVIRUS, NAA
SARS-CoV-2, NAA: NOT DETECTED
SARS-CoV-2, NAA: NOT DETECTED

## 2019-03-02 ENCOUNTER — Other Ambulatory Visit: Payer: Self-pay | Admitting: *Deleted

## 2019-03-02 DIAGNOSIS — Z20822 Contact with and (suspected) exposure to covid-19: Secondary | ICD-10-CM

## 2019-03-09 ENCOUNTER — Other Ambulatory Visit: Payer: Self-pay

## 2019-03-09 DIAGNOSIS — Z20822 Contact with and (suspected) exposure to covid-19: Secondary | ICD-10-CM

## 2019-03-12 LAB — NOVEL CORONAVIRUS, NAA: SARS-CoV-2, NAA: NOT DETECTED

## 2019-03-14 ENCOUNTER — Encounter: Payer: Self-pay | Admitting: Family Medicine

## 2019-03-14 DIAGNOSIS — N390 Urinary tract infection, site not specified: Secondary | ICD-10-CM

## 2019-03-15 MED ORDER — NITROFURANTOIN MONOHYD MACRO 100 MG PO CAPS: 100.0000 mg | ORAL_CAPSULE | Freq: Two times a day (BID) | ORAL | 0 refills | Status: DC

## 2019-03-15 NOTE — Addendum Note (Signed)
Addended by: Lamar Blinks C on: 03/15/2019 05:12 AM   Modules accepted: Orders

## 2019-03-16 ENCOUNTER — Other Ambulatory Visit: Payer: Self-pay

## 2019-03-16 ENCOUNTER — Other Ambulatory Visit: Payer: 59

## 2019-03-16 DIAGNOSIS — N390 Urinary tract infection, site not specified: Secondary | ICD-10-CM

## 2019-03-18 LAB — URINE CULTURE
MICRO NUMBER:: 725003
SPECIMEN QUALITY:: ADEQUATE

## 2019-03-19 ENCOUNTER — Encounter: Payer: Self-pay | Admitting: Family Medicine

## 2019-03-30 ENCOUNTER — Encounter: Payer: Self-pay | Admitting: Family

## 2019-03-30 ENCOUNTER — Telehealth: Payer: 59 | Admitting: Family

## 2019-03-30 DIAGNOSIS — J019 Acute sinusitis, unspecified: Secondary | ICD-10-CM | POA: Diagnosis not present

## 2019-03-30 DIAGNOSIS — B9789 Other viral agents as the cause of diseases classified elsewhere: Secondary | ICD-10-CM | POA: Diagnosis not present

## 2019-03-30 MED ORDER — FLUTICASONE PROPIONATE 50 MCG/ACT NA SUSP: 2.0000 | Freq: Every day | NASAL | 0 refills | Status: DC

## 2019-03-30 NOTE — Progress Notes (Signed)
We are sorry that you are not feeling well.  Here is how we plan to help!  Based on what you have shared with me it looks like you have sinusitis.  Sinusitis is inflammation and infection in the sinus cavities of the head.  Based on your presentation I believe you most likely have Acute Viral Sinusitis.This is an infection most likely caused by a virus. There is not specific treatment for viral sinusitis other than to help you with the symptoms until the infection runs its course.  You may use an oral decongestant such as Mucinex D or if you have glaucoma or high blood pressure use plain Mucinex. Saline nasal spray help and can safely be used as often as needed for congestion, I have prescribed: Fluticasone nasal spray two sprays in each nostril once a day  Some authorities believe that zinc sprays or the use of Echinacea may shorten the course of your symptoms.  Sinus infections are not as easily transmitted as other respiratory infection, however we still recommend that you avoid close contact with loved ones, especially the very young and elderly.  Remember to wash your hands thoroughly throughout the day as this is the number one way to prevent the spread of infection!  Home Care:  Only take medications as instructed by your medical team.  Do not take these medications with alcohol.  A steam or ultrasonic humidifier can help congestion.  You can place a towel over your head and breathe in the steam from hot water coming from a faucet.  Avoid close contacts especially the very young and the elderly.  Cover your mouth when you cough or sneeze.  Always remember to wash your hands.  Get Help Right Away If:  You develop worsening fever or sinus pain.  You develop a severe head ache or visual changes.  Your symptoms persist after you have completed your treatment plan.  Make sure you  Understand these instructions.  Will watch your condition.  Will get help right away if you are not  doing well or get worse.  Your e-visit answers were reviewed by a board certified advanced clinical practitioner to complete your personal care plan.  Depending on the condition, your plan could have included both over the counter or prescription medications.  If there is a problem please reply  once you have received a response from your provider.  Your safety is important to us.  If you have drug allergies check your prescription carefully.    You can use MyChart to ask questions about today's visit, request a non-urgent call back, or ask for a work or school excuse for 24 hours related to this e-Visit. If it has been greater than 24 hours you will need to follow up with your provider, or enter a new e-Visit to address those concerns.  You will get an e-mail in the next two days asking about your experience.  I hope that your e-visit has been valuable and will speed your recovery. Thank you for using e-visits.  Greater than 5 minutes, yet less than 10 minutes of time have been spent researching, coordinating, and implementing care for this patient today.  Thank you for the details you included in the comment boxes. Those details are very helpful in determining the best course of treatment for you and help us to provide the best care.  

## 2019-04-08 NOTE — Progress Notes (Signed)
COVID-19 Screening performed. Temperature. No additional needs assessed at this time.  Dnaiel Voller MSN, RN 

## 2019-04-09 ENCOUNTER — Other Ambulatory Visit: Payer: Self-pay

## 2019-04-09 DIAGNOSIS — Z20822 Contact with and (suspected) exposure to covid-19: Secondary | ICD-10-CM

## 2019-04-10 LAB — NOVEL CORONAVIRUS, NAA: SARS-CoV-2, NAA: NOT DETECTED

## 2019-04-16 ENCOUNTER — Ambulatory Visit: Payer: 59 | Admitting: Family Medicine

## 2019-04-25 ENCOUNTER — Encounter: Payer: Self-pay | Admitting: Family Medicine

## 2019-04-25 DIAGNOSIS — N390 Urinary tract infection, site not specified: Secondary | ICD-10-CM

## 2019-04-26 MED ORDER — CIPROFLOXACIN HCL 250 MG PO TABS: 250.0000 mg | ORAL_TABLET | Freq: Two times a day (BID) | ORAL | 0 refills | Status: DC

## 2019-04-26 NOTE — Addendum Note (Signed)
Addended by: Lamar Blinks C on: 04/26/2019 05:06 AM   Modules accepted: Orders

## 2019-04-27 ENCOUNTER — Other Ambulatory Visit: Payer: 59

## 2019-04-27 ENCOUNTER — Other Ambulatory Visit: Payer: Self-pay

## 2019-04-27 DIAGNOSIS — N390 Urinary tract infection, site not specified: Secondary | ICD-10-CM

## 2019-04-30 ENCOUNTER — Encounter: Payer: Self-pay | Admitting: Family Medicine

## 2019-04-30 ENCOUNTER — Ambulatory Visit: Payer: 59 | Admitting: Family Medicine

## 2019-04-30 LAB — URINE CULTURE
MICRO NUMBER:: 871393
SPECIMEN QUALITY:: ADEQUATE

## 2019-05-11 ENCOUNTER — Other Ambulatory Visit: Payer: Self-pay

## 2019-05-11 DIAGNOSIS — Z20822 Contact with and (suspected) exposure to covid-19: Secondary | ICD-10-CM

## 2019-05-12 LAB — NOVEL CORONAVIRUS, NAA: SARS-CoV-2, NAA: NOT DETECTED

## 2019-06-15 ENCOUNTER — Other Ambulatory Visit: Payer: Self-pay

## 2019-06-15 DIAGNOSIS — Z20822 Contact with and (suspected) exposure to covid-19: Secondary | ICD-10-CM

## 2019-06-17 LAB — NOVEL CORONAVIRUS, NAA: SARS-CoV-2, NAA: NOT DETECTED

## 2019-07-06 ENCOUNTER — Other Ambulatory Visit: Payer: Self-pay

## 2019-07-06 DIAGNOSIS — Z20822 Contact with and (suspected) exposure to covid-19: Secondary | ICD-10-CM

## 2019-07-09 LAB — NOVEL CORONAVIRUS, NAA: SARS-CoV-2, NAA: NOT DETECTED

## 2019-08-02 ENCOUNTER — Other Ambulatory Visit: Payer: Self-pay | Admitting: *Deleted

## 2019-08-02 DIAGNOSIS — Z20822 Contact with and (suspected) exposure to covid-19: Secondary | ICD-10-CM

## 2019-08-04 LAB — NOVEL CORONAVIRUS, NAA: SARS-CoV-2, NAA: NOT DETECTED

## 2019-08-06 ENCOUNTER — Other Ambulatory Visit: Payer: Self-pay

## 2019-08-06 DIAGNOSIS — Z20822 Contact with and (suspected) exposure to covid-19: Secondary | ICD-10-CM

## 2019-08-07 LAB — NOVEL CORONAVIRUS, NAA: SARS-CoV-2, NAA: NOT DETECTED

## 2019-08-31 ENCOUNTER — Other Ambulatory Visit: Payer: Self-pay | Admitting: *Deleted

## 2019-08-31 DIAGNOSIS — Z20822 Contact with and (suspected) exposure to covid-19: Secondary | ICD-10-CM

## 2019-09-01 LAB — NOVEL CORONAVIRUS, NAA: SARS-CoV-2, NAA: NOT DETECTED

## 2019-09-17 ENCOUNTER — Other Ambulatory Visit: Payer: 59

## 2019-09-17 ENCOUNTER — Ambulatory Visit: Payer: 59 | Attending: Internal Medicine

## 2019-09-17 DIAGNOSIS — Z20822 Contact with and (suspected) exposure to covid-19: Secondary | ICD-10-CM

## 2019-09-18 LAB — NOVEL CORONAVIRUS, NAA: SARS-CoV-2, NAA: NOT DETECTED

## 2019-10-26 ENCOUNTER — Other Ambulatory Visit: Payer: Self-pay | Admitting: *Deleted

## 2019-10-26 DIAGNOSIS — Z20822 Contact with and (suspected) exposure to covid-19: Secondary | ICD-10-CM

## 2019-10-27 LAB — NOVEL CORONAVIRUS, NAA: SARS-CoV-2, NAA: NOT DETECTED

## 2019-12-07 ENCOUNTER — Other Ambulatory Visit: Payer: Self-pay | Admitting: Family Medicine

## 2019-12-07 DIAGNOSIS — M797 Fibromyalgia: Secondary | ICD-10-CM

## 2019-12-07 MED ORDER — CYCLOBENZAPRINE HCL 10 MG PO TABS: 10.0000 mg | ORAL_TABLET | Freq: Two times a day (BID) | ORAL | 0 refills | Status: DC | PRN

## 2019-12-24 ENCOUNTER — Telehealth: Payer: 59 | Admitting: Emergency Medicine

## 2019-12-24 DIAGNOSIS — R300 Dysuria: Secondary | ICD-10-CM | POA: Diagnosis not present

## 2019-12-24 MED ORDER — NITROFURANTOIN MONOHYD MACRO 100 MG PO CAPS: 100.0000 mg | ORAL_CAPSULE | Freq: Two times a day (BID) | ORAL | 0 refills | Status: DC

## 2019-12-24 NOTE — Progress Notes (Signed)
We are sorry that you are not feeling well.  Here is how we plan to help!  Based on what you shared with me it looks like you most likely have a simple urinary tract infection.  A UTI (Urinary Tract Infection) is a bacterial infection of the bladder.  Most cases of urinary tract infections are simple to treat but a key part of your care is to encourage you to drink plenty of fluids and watch your symptoms carefully.  I've reviewed your most recent urine cultures.  The antibiotics that you are sensitive to include Cipro and Macrobid.  Due to Cipro's black box warning for uncomplicated UTIs, I don't want to prescribe you that.  In my opinion, the risk is not worth the benefit.  I have prescribed MacroBid 100 mg twice a day for 5 days.  Your symptoms should gradually improve. Call us if the burning in your urine worsens, you develop worsening fever, back pain or pelvic pain or if your symptoms do not resolve after completing the antibiotic.  Urinary tract infections can be prevented by drinking plenty of water to keep your body hydrated.  Also be sure when you wipe, wipe from front to back and don't hold it in!  If possible, empty your bladder every 4 hours.  Your e-visit answers were reviewed by a board certified advanced clinical practitioner to complete your personal care plan.  Depending on the condition, your plan could have included both over the counter or prescription medications.  If there is a problem please reply  once you have received a response from your provider.  Your safety is important to [place].  If you have drug allergies check your prescription carefully.    You can use MyChart to ask questions about today's visit, request a non-urgent call back, or ask for a work or school excuse for 24 hours related to this e-Visit. If it has been greater than 24 hours you will need to follow up with your provider, or enter a new e-Visit to address those concerns.   You will get an e-mail in  the next two days asking about your experience.  I hope that your e-visit has been valuable and will speed your recovery. Thank you for using e-visits.   Greater than 5 minutes, yet less than 10 minutes was used in reviewing the patient's chart, questionnaire, prescribing medications, and documentation for this visit.

## 2020-04-16 ENCOUNTER — Telehealth (INDEPENDENT_AMBULATORY_CARE_PROVIDER_SITE_OTHER): Payer: 59 | Admitting: Family Medicine

## 2020-04-16 ENCOUNTER — Other Ambulatory Visit: Payer: Self-pay

## 2020-04-16 ENCOUNTER — Encounter: Payer: Self-pay | Admitting: Family Medicine

## 2020-04-16 VITALS — Temp 98.9°F | Ht 63.0 in

## 2020-04-16 DIAGNOSIS — J0140 Acute pansinusitis, unspecified: Secondary | ICD-10-CM | POA: Diagnosis not present

## 2020-04-16 MED ORDER — DOXYCYCLINE HYCLATE 100 MG PO TABS: 100.0000 mg | ORAL_TABLET | Freq: Two times a day (BID) | ORAL | 0 refills | Status: DC

## 2020-04-16 MED ORDER — PREDNISONE 10 MG PO TABS: ORAL_TABLET | ORAL | 0 refills | Status: DC

## 2020-04-16 NOTE — Progress Notes (Signed)
Virtual Visit via Video Note  I connected with Jane Fletcher on 04/16/20 at 10:20 AM EDT by a video enabled telemedicine application and verified that I am speaking with the correct person using two identifiers.  Location/ persons in visit  Patient: home alone  Provider: home    I discussed the limitations of evaluation and management by telemedicine and the availability of in person appointments. The patient expressed understanding and agreed to proceed.  History of Present Illness: Pt is home c/o cold / sinus congestion x 2 weeks.   Pt was tested on 8/19 for covid--- negative.    Pt taking mucinex / sudafed with no relief  She has had the covid vaccine    Observations/Objective: Vitals:   04/16/20 1020  Temp: 98.9 F (37.2 C)    nad , no sob  Assessment and Plan: 1. Acute non-recurrent pansinusitis Saline nasal spray abx per orders and pred taper Call back prn  covid test neg and pt has had covid vaccine  - doxycycline (VIBRA-TABS) 100 MG tablet; Take 1 tablet (100 mg total) by mouth 2 (two) times daily.  Dispense: 20 tablet; Refill: 0 - predniSONE (DELTASONE) 10 MG tablet; TAKE 3 TABLETS PO QD FOR 3 DAYS THEN TAKE 2 TABLETS PO QD FOR 3 DAYS THEN TAKE 1 TABLET PO QD FOR 3 DAYS THEN TAKE 1/2 TAB PO QD FOR 3 DAYS  Dispense: 20 tablet; Refill: 0   Follow Up Instructions:    I discussed the assessment and treatment plan with the patient. The patient was provided an opportunity to ask questions and all were answered. The patient agreed with the plan and demonstrated an understanding of the instructions.   The patient was advised to call back or seek an in-person evaluation if the symptoms worsen or if the condition fails to improve as anticipated.  I provided 25 minutes of non-face-to-face time during this encounter.   Donato Schultz, DO

## 2020-05-07 ENCOUNTER — Other Ambulatory Visit: Payer: 59

## 2020-05-07 ENCOUNTER — Other Ambulatory Visit: Payer: Self-pay | Admitting: Radiology

## 2020-05-07 DIAGNOSIS — Z20822 Contact with and (suspected) exposure to covid-19: Secondary | ICD-10-CM

## 2020-05-08 LAB — NOVEL CORONAVIRUS, NAA: SARS-CoV-2, NAA: NOT DETECTED

## 2020-05-08 LAB — SARS-COV-2, NAA 2 DAY TAT

## 2020-06-14 NOTE — Progress Notes (Addendum)
[facility] [address], [address] [place], [place] [address] [phone]  Date:  06/18/2020   Name:  Jane Fletcher   DOB:  [birth year]   MRN:  [id number]  PCP:  Pearline Cables, MD    Chief Complaint: Annual Exam and Flu Vaccine   History of Present Illness:  Jane Fletcher is a 44 y.o. very pleasant female patient who presents with the following:  Here today for a physical Generally in good health Last seen by myself just over a year ago She was in for a sinus infection about 2 months ago- this cleared up  She has noted some fatigue-would like to have her thyroid checked She does tend to snore- she got tested for sleep apnea, was told she has mild OSA.  For the time being she has elected not to use a CPAP machine She has worked on her diet but is gaining weight despite her efforts  Married to [employer] C screening HIV screening Flu vaccine- today  covid vaccine Pap is UTD mammo - not done yet, will order for her  Most recently labs 2019  She notes regular menstrual cycles Patient Active Problem List   Diagnosis Date Noted  . Left Achilles tendinitis 02/08/2018  . Fibromyalgia 12/30/2014  . Obesity 12/30/2014    Past Medical History:  Diagnosis Date  . Allergy   . Anxiety   . Fibromyalgia     Past Surgical History:  Procedure Laterality Date  . CHOLECYSTECTOMY      Social History   Tobacco Use  . Smoking status: Never Smoker  . Smokeless tobacco: Never Used  Vaping Use  . Vaping Use: Never used  Substance Use Topics  . Alcohol use: No  . Drug use: No    Family History  Problem Relation Age of Onset  . Hypertension Mother   . Mental illness Sister   . Migraines Sister     Allergies  Allergen Reactions  . Penicillins Hives    Medication list has been reviewed and updated.  Current Outpatient Medications on File Prior to Visit  Medication Sig Dispense Refill  . cetirizine  (ZYRTEC) 10 MG tablet Take 1 tablet (10 mg total) by mouth daily. 90 tablet 3  . naproxen sodium (ANAPROX) 220 MG tablet Take 220 mg by mouth 2 (two) times daily with a meal.     No current facility-administered medications on file prior to visit.    Review of Systems: As per HPI- otherwise negative.    Physical Examination: Vitals:   06/18/20 1343 06/18/20 1357  BP: (!) 128/98 130/90  Pulse: (!) 106 90  Resp: 16   SpO2: 99%    There were no vitals filed for this visit. There is no height or weight on file to calculate BMI. Ideal Body Weight:    GEN: no acute distress. Obese, looks well  HEENT: Atraumatic, Normocephalic.  Ears and Nose: No external deformity. CV: RRR, No M/G/R. No JVD. No thrill. No extra heart sounds. PULM: CTA B, no wheezes, crackles, rhonchi. No retractions. No resp. distress. No accessory muscle use. ABD: S, NT, ND, +BS. No rebound. No HSM. EXTR: No c/c/e PSYCH: Normally interactive. Conversant.   BMI 42.5  Assessment and Plan: Physical exam  Screening for deficiency anemia - Plan: CBC  Screening for diabetes mellitus - Plan: Comprehensive metabolic panel, Hemoglobin A1c  Screening for hyperlipidemia - Plan: Lipid panel  Screening for thyroid disorder -  Plan: TSH  Encounter for vitamin deficiency screening - Plan: VITAMIN D 25 Hydroxy (Vit-D Deficiency, Fractures)  Encounter for hepatitis C screening test for low risk patient - Plan: Hepatitis C antibody  Screening for HIV (human immunodeficiency virus) - Plan: HIV Antibody (routine testing w rflx)  Encounter for screening mammogram for malignant neoplasm of breast - Plan: MM 3D SCREEN BREAST BILATERAL  Needs flu shot - Plan: Flu Vaccine QUAD 6+ mos PF IM (Fluarix Quad PF)  Vitamin D deficiency - Plan: Cholecalciferol (VITAMIN D3) 1.25 MG (50000 UT) CAPS  Patient today for physical exam Flu shot given Mammogram ordered Pap up-to-date Routine labs pending as above Encouraged routine  exercise, and healthy diet Will plan further follow- up pending labs.  This visit occurred during the SARS-CoV-2 public health emergency.  Safety protocols were in place, including screening questions prior to the visit, additional usage of staff PPE, and extensive cleaning of exam room while observing appropriate contact time as indicated for disinfecting solutions.    Signed Abbe Amsterdam, MD  Received her labs as below, 11/4.  Message to patient  Results for orders placed or performed in visit on 06/18/20  CBC  Result Value Ref Range   WBC 6.2 3.8 - 10.8 Thousand/uL   RBC 3.69 (L) 3.80 - 5.10 Million/uL   Hemoglobin 11.9 11.7 - 15.5 g/dL   HCT 85.2 (L) 35 - 45 %   MCV 93.2 80.0 - 100.0 fL   MCH 32.2 27.0 - 33.0 pg   MCHC 34.6 32.0 - 36.0 g/dL   RDW 77.8 24.2 - 35.3 %   Platelets 236 140 - 400 Thousand/uL   MPV 11.5 7.5 - 12.5 fL  Comprehensive metabolic panel  Result Value Ref Range   Glucose, Bld 106 (H) 65 - 99 mg/dL   BUN 13 7 - 25 mg/dL   Creat 6.14 4.31 - 5.40 mg/dL   BUN/Creatinine Ratio NOT APPLICABLE 6 - 22 (calc)   Sodium 142 135 - 146 mmol/L   Potassium 4.1 3.5 - 5.3 mmol/L   Chloride 104 98 - 110 mmol/L   CO2 27 20 - 32 mmol/L   Calcium 9.5 8.6 - 10.2 mg/dL   Total Protein 7.2 6.1 - 8.1 g/dL   Albumin 4.0 3.6 - 5.1 g/dL   Globulin 3.2 1.9 - 3.7 g/dL (calc)   AG Ratio 1.3 1.0 - 2.5 (calc)   Total Bilirubin 0.4 0.2 - 1.2 mg/dL   Alkaline phosphatase (APISO) 46 31 - 125 U/L   AST 11 10 - 30 U/L   ALT 7 6 - 29 U/L  Hemoglobin A1c  Result Value Ref Range   Hgb A1c MFr Bld 5.1 <5.7 % of total Hgb   Mean Plasma Glucose 100 (calc)   eAG (mmol/L) 5.5 (calc)  Hepatitis C antibody  Result Value Ref Range   Hepatitis C Ab NON-REACTIVE NON-REACTI   SIGNAL TO CUT-OFF 0.02 <1.00  Lipid panel  Result Value Ref Range   Cholesterol 217 (H) <200 mg/dL   HDL 46 (L) > OR = 50 mg/dL   Triglycerides 086 (H) <150 mg/dL   LDL Cholesterol (Calc) 134 (H) mg/dL (calc)    Total CHOL/HDL Ratio 4.7 <5.0 (calc)   Non-HDL Cholesterol (Calc) 171 (H) <130 mg/dL (calc)  HIV Antibody (routine testing w rflx)  Result Value Ref Range   HIV 1&2 Ab, 4th Generation NON-REACTIVE NON-REACTI  TSH  Result Value Ref Range   TSH 0.65 mIU/L  VITAMIN D 25 Hydroxy (Vit-D Deficiency,  Fractures)  Result Value Ref Range   Vit D, 25-Hydroxy 11 (L) 30 - 100 ng/mL

## 2020-06-14 NOTE — Patient Instructions (Addendum)
Good to see you again! I will be in touch with your labs asap Please get your mammogram asap Flu vaccine today   [email or web address] has pediatric covid vaccine registration online   Work on a regular exercise program (walking is fine!) and gradual weight loss  Health Maintenance, Female Adopting a healthy lifestyle and getting preventive care are important in promoting health and wellness. Ask your health care provider about:  The right schedule for you to have regular tests and exams.  Things you can do on your own to prevent diseases and keep yourself healthy. What should I know about diet, weight, and exercise? Eat a healthy diet   Eat a diet that includes plenty of vegetables, fruits, low-fat dairy products, and lean protein.  Do not eat a lot of foods that are high in solid fats, added sugars, or sodium. Maintain a healthy weight Body mass index (BMI) is used to identify weight problems. It estimates body fat based on height and weight. Your health care provider can help determine your BMI and help you achieve or maintain a healthy weight. Get regular exercise Get regular exercise. This is one of the most important things you can do for your health. Most adults should:  Exercise for at least 150 minutes each week. The exercise should increase your heart rate and make you sweat (moderate-intensity exercise).  Do strengthening exercises at least twice a week. This is in addition to the moderate-intensity exercise.  Spend less time sitting. Even light physical activity can be beneficial. Watch cholesterol and blood lipids Have your blood tested for lipids and cholesterol at 42 years of age, then have this test every 5 years. Have your cholesterol levels checked more often if:  Your lipid or cholesterol levels are high.  You are older than 42 years of age.  You are at high risk for heart disease. What should I know about cancer screening? Depending on your health history and  family history, you may need to have cancer screening at various ages. This may include screening for:  Breast cancer.  Cervical cancer.  Colorectal cancer.  Skin cancer.  Lung cancer. What should I know about heart disease, diabetes, and high blood pressure? Blood pressure and heart disease  High blood pressure causes heart disease and increases the risk of stroke. This is more likely to develop in people who have high blood pressure readings, are of African descent, or are overweight.  Have your blood pressure checked: ? Every 3-5 years if you are 46-39 years of age. ? Every year if you are 36 years old or older. Diabetes Have regular diabetes screenings. This checks your fasting blood sugar level. Have the screening done:  Once every three years after age 49 if you are at a normal weight and have a low risk for diabetes.  More often and at a younger age if you are overweight or have a high risk for diabetes. What should I know about preventing infection? Hepatitis B If you have a higher risk for hepatitis B, you should be screened for this virus. Talk with your health care provider to find out if you are at risk for hepatitis B infection. Hepatitis C Testing is recommended for:  Everyone born from [birth year] through 1963.  Anyone with known risk factors for hepatitis C. Sexually transmitted infections (STIs)  Get screened for STIs, including gonorrhea and chlamydia, if: ? You are sexually active and are younger than 42 years of age. ? You are older than 42  years of age and your health care provider tells you that you are at risk for this type of infection. ? Your sexual activity has changed since you were last screened, and you are at increased risk for chlamydia or gonorrhea. Ask your health care provider if you are at risk.  Ask your health care provider about whether you are at high risk for HIV. Your health care provider may recommend a prescription medicine to help prevent  HIV infection. If you choose to take medicine to prevent HIV, you should first get tested for HIV. You should then be tested every 3 months for as long as you are taking the medicine. Pregnancy  If you are about to stop having your period (premenopausal) and you may become pregnant, seek counseling before you get pregnant.  Take 400 to 800 micrograms (mcg) of folic acid every day if you become pregnant.  Ask for birth control (contraception) if you want to prevent pregnancy. Osteoporosis and menopause Osteoporosis is a disease in which the bones lose minerals and strength with aging. This can result in bone fractures. If you are 71 years old or older, or if you are at risk for osteoporosis and fractures, ask your health care provider if you should:  Be screened for bone loss.  Take a calcium or vitamin D supplement to lower your risk of fractures.  Be given hormone replacement therapy (HRT) to treat symptoms of menopause. Follow these instructions at home: Lifestyle  Do not use any products that contain nicotine or tobacco, such as cigarettes, e-cigarettes, and chewing tobacco. If you need help quitting, ask your health care provider.  Do not use street drugs.  Do not share needles.  Ask your health care provider for help if you need support or information about quitting drugs. Alcohol use  Do not drink alcohol if: ? Your health care provider tells you not to drink. ? You are pregnant, may be pregnant, or are planning to become pregnant.  If you drink alcohol: ? Limit how much you use to 0-1 drink a day. ? Limit intake if you are breastfeeding.  Be aware of how much alcohol is in your drink. In the [place] one drink equals one 12 oz bottle of beer (355 mL), one 5 oz glass of wine (148 mL), or one 1 oz glass of hard liquor (44 mL). General instructions  Schedule regular health, dental, and eye exams.  Stay current with your vaccines.  Tell your health care provider if: ? You  often feel depressed. ? You have ever been abused or do not feel safe at home. Summary  Adopting a healthy lifestyle and getting preventive care are important in promoting health and wellness.  Follow your health care provider's instructions about healthy diet, exercising, and getting tested or screened for diseases.  Follow your health care provider's instructions on monitoring your cholesterol and blood pressure. This information is not intended to replace advice given to you by your health care provider. Make sure you discuss any questions you have with your health care provider. Document Revised: 07/26/2018 Document Reviewed: 07/26/2018 Elsevier Patient Education  2020 [employer].

## 2020-06-18 ENCOUNTER — Other Ambulatory Visit: Payer: Self-pay

## 2020-06-18 ENCOUNTER — Ambulatory Visit (INDEPENDENT_AMBULATORY_CARE_PROVIDER_SITE_OTHER): Payer: 59 | Admitting: Family Medicine

## 2020-06-18 VITALS — BP 130/90 | HR 90 | Resp 16 | Ht 63.0 in | Wt 240.0 lb

## 2020-06-18 DIAGNOSIS — Z130 Encounter for screening for diseases of the blood and blood-forming organs and certain disorders involving the immune mechanism: Secondary | ICD-10-CM

## 2020-06-18 DIAGNOSIS — Z0000 Encounter for general adult medical examination without abnormal findings: Secondary | ICD-10-CM | POA: Diagnosis not present

## 2020-06-18 DIAGNOSIS — Z131 Encounter for screening for diabetes mellitus: Secondary | ICD-10-CM | POA: Diagnosis not present

## 2020-06-18 DIAGNOSIS — Z13220 Encounter for screening for lipoid disorders: Secondary | ICD-10-CM

## 2020-06-18 DIAGNOSIS — Z1329 Encounter for screening for other suspected endocrine disorder: Secondary | ICD-10-CM

## 2020-06-18 DIAGNOSIS — Z114 Encounter for screening for human immunodeficiency virus [HIV]: Secondary | ICD-10-CM

## 2020-06-18 DIAGNOSIS — Z1159 Encounter for screening for other viral diseases: Secondary | ICD-10-CM

## 2020-06-18 DIAGNOSIS — Z1231 Encounter for screening mammogram for malignant neoplasm of breast: Secondary | ICD-10-CM

## 2020-06-18 DIAGNOSIS — Z23 Encounter for immunization: Secondary | ICD-10-CM

## 2020-06-18 DIAGNOSIS — Z1321 Encounter for screening for nutritional disorder: Secondary | ICD-10-CM

## 2020-06-18 DIAGNOSIS — E559 Vitamin D deficiency, unspecified: Secondary | ICD-10-CM

## 2020-06-19 ENCOUNTER — Encounter: Payer: Self-pay | Admitting: Family Medicine

## 2020-06-19 DIAGNOSIS — Z6841 Body Mass Index (BMI) 40.0 and over, adult: Secondary | ICD-10-CM

## 2020-06-19 LAB — COMPREHENSIVE METABOLIC PANEL
AG Ratio: 1.3 (calc) (ref 1.0–2.5)
ALT: 7 U/L (ref 6–29)
AST: 11 U/L (ref 10–30)
Albumin: 4 g/dL (ref 3.6–5.1)
Alkaline phosphatase (APISO): 46 U/L (ref 31–125)
BUN: 13 mg/dL (ref 7–25)
CO2: 27 mmol/L (ref 20–32)
Calcium: 9.5 mg/dL (ref 8.6–10.2)
Chloride: 104 mmol/L (ref 98–110)
Creat: 0.7 mg/dL (ref 0.50–1.10)
Globulin: 3.2 g/dL (calc) (ref 1.9–3.7)
Glucose, Bld: 106 mg/dL — ABNORMAL HIGH (ref 65–99)
Potassium: 4.1 mmol/L (ref 3.5–5.3)
Sodium: 142 mmol/L (ref 135–146)
Total Bilirubin: 0.4 mg/dL (ref 0.2–1.2)
Total Protein: 7.2 g/dL (ref 6.1–8.1)

## 2020-06-19 LAB — VITAMIN D 25 HYDROXY (VIT D DEFICIENCY, FRACTURES): Vit D, 25-Hydroxy: 11 ng/mL — ABNORMAL LOW (ref 30–100)

## 2020-06-19 LAB — CBC
HCT: 34.4 % — ABNORMAL LOW (ref 35.0–45.0)
Hemoglobin: 11.9 g/dL (ref 11.7–15.5)
MCH: 32.2 pg (ref 27.0–33.0)
MCHC: 34.6 g/dL (ref 32.0–36.0)
MCV: 93.2 fL (ref 80.0–100.0)
MPV: 11.5 fL (ref 7.5–12.5)
Platelets: 236 10*3/uL (ref 140–400)
RBC: 3.69 10*6/uL — ABNORMAL LOW (ref 3.80–5.10)
RDW: 12.3 % (ref 11.0–15.0)
WBC: 6.2 10*3/uL (ref 3.8–10.8)

## 2020-06-19 LAB — HEPATITIS C ANTIBODY
Hepatitis C Ab: NONREACTIVE
SIGNAL TO CUT-OFF: 0.02 (ref <1.00)

## 2020-06-19 LAB — LIPID PANEL
Cholesterol: 217 mg/dL — ABNORMAL HIGH (ref <200)
HDL: 46 mg/dL — ABNORMAL LOW (ref >=50)
LDL Cholesterol (Calc): 134 mg/dL (calc) — ABNORMAL HIGH
Non-HDL Cholesterol (Calc): 171 mg/dL (calc) — ABNORMAL HIGH (ref <130)
Total CHOL/HDL Ratio: 4.7 (calc) (ref <5.0)
Triglycerides: 232 mg/dL — ABNORMAL HIGH (ref <150)

## 2020-06-19 LAB — HIV ANTIBODY (ROUTINE TESTING W REFLEX): HIV 1&2 Ab, 4th Generation: NONREACTIVE

## 2020-06-19 LAB — HEMOGLOBIN A1C
Hgb A1c MFr Bld: 5.1 % of total Hgb (ref <5.7)
Mean Plasma Glucose: 100 (calc)
eAG (mmol/L): 5.5 (calc)

## 2020-06-19 LAB — TSH: TSH: 0.65 mIU/L

## 2020-06-19 MED ORDER — VITAMIN D3 1.25 MG (50000 UT) PO CAPS: ORAL_CAPSULE | ORAL | 0 refills | Status: DC

## 2020-06-19 NOTE — Addendum Note (Signed)
Addended by: Abbe Amsterdam C on: 06/19/2020 05:23 AM   Modules accepted: Orders

## 2020-06-20 ENCOUNTER — Other Ambulatory Visit: Payer: Self-pay | Admitting: Family Medicine

## 2020-06-20 DIAGNOSIS — M797 Fibromyalgia: Secondary | ICD-10-CM

## 2020-06-20 MED ORDER — CYCLOBENZAPRINE HCL 10 MG PO TABS: 10.0000 mg | ORAL_TABLET | Freq: Two times a day (BID) | ORAL | 1 refills | Status: DC | PRN

## 2020-06-23 ENCOUNTER — Telehealth: Payer: Self-pay

## 2020-06-23 ENCOUNTER — Encounter: Payer: Self-pay | Admitting: Family Medicine

## 2020-06-23 MED ORDER — SAXENDA 18 MG/3ML ~~LOC~~ SOPN: PEN_INJECTOR | SUBCUTANEOUS | 3 refills | Status: DC

## 2020-06-23 MED ORDER — PEN NEEDLES 31G X 6 MM MISC: 3 refills | Status: DC

## 2020-06-23 NOTE — Addendum Note (Signed)
Addended by: Abbe Amsterdam C on: 06/23/2020 05:48 AM   Modules accepted: Orders

## 2020-06-23 NOTE — Telephone Encounter (Signed)
PA denied. Not a covered benefit. Weight loss medications are excluded from plan.

## 2020-06-23 NOTE — Telephone Encounter (Signed)
PA initiated via Covermymeds; KEY: [id number]. Awaiting determination.

## 2020-08-28 ENCOUNTER — Telehealth: Payer: 59 | Admitting: Family

## 2020-08-28 DIAGNOSIS — J0100 Acute maxillary sinusitis, unspecified: Secondary | ICD-10-CM

## 2020-08-28 MED ORDER — DOXYCYCLINE HYCLATE 100 MG PO TABS: 100.0000 mg | ORAL_TABLET | Freq: Two times a day (BID) | ORAL | 0 refills | Status: DC

## 2020-08-28 NOTE — Progress Notes (Signed)
We are sorry that you are not feeling well.  Here is how we plan to help!  Based on what you have shared with me it looks like you have sinusitis.  Sinusitis is inflammation and infection in the sinus cavities of the head.  Based on your presentation I believe you most likely have Acute Bacterial Sinusitis.  This is an infection caused by bacteria and is treated with antibiotics. I have prescribed Doxycycline 100mg by mouth twice a day for 10 days. You may use an oral decongestant such as Mucinex D or if you have glaucoma or high blood pressure use plain Mucinex. Saline nasal spray help and can safely be used as often as needed for congestion.  If you develop worsening sinus pain, fever or notice severe headache and vision changes, or if symptoms are not better after completion of antibiotic, please schedule an appointment with a health care provider.    Sinus infections are not as easily transmitted as other respiratory infection, however we still recommend that you avoid close contact with loved ones, especially the very young and elderly.  Remember to wash your hands thoroughly throughout the day as this is the number one way to prevent the spread of infection!  Home Care:  Only take medications as instructed by your medical team.  Complete the entire course of an antibiotic.  Do not take these medications with alcohol.  A steam or ultrasonic humidifier can help congestion.  You can place a towel over your head and breathe in the steam from hot water coming from a faucet.  Avoid close contacts especially the very young and the elderly.  Cover your mouth when you cough or sneeze.  Always remember to wash your hands.  Get Help Right Away If:  You develop worsening fever or sinus pain.  You develop a severe head ache or visual changes.  Your symptoms persist after you have completed your treatment plan.  Make sure you  Understand these instructions.  Will watch your  condition.  Will get help right away if you are not doing well or get worse.  Your e-visit answers were reviewed by a board certified advanced clinical practitioner to complete your personal care plan.  Depending on the condition, your plan could have included both over the counter or prescription medications.  If there is a problem please reply  once you have received a response from your provider.  Your safety is important to us.  If you have drug allergies check your prescription carefully.    You can use MyChart to ask questions about today's visit, request a non-urgent call back, or ask for a work or school excuse for 24 hours related to this e-Visit. If it has been greater than 24 hours you will need to follow up with your provider, or enter a new e-Visit to address those concerns.  You will get an e-mail in the next two days asking about your experience.  I hope that your e-visit has been valuable and will speed your recovery. Thank you for using e-visits.  Greater than 5 minutes, yet less than 10 minutes of time have been spent researching, coordinating, and implementing care for this patient.    

## 2021-01-14 ENCOUNTER — Other Ambulatory Visit: Payer: 59

## 2021-01-14 ENCOUNTER — Encounter: Payer: Self-pay | Admitting: Physician Assistant

## 2021-01-14 ENCOUNTER — Telehealth: Payer: 59 | Admitting: Physician Assistant

## 2021-01-14 DIAGNOSIS — Z20822 Contact with and (suspected) exposure to covid-19: Secondary | ICD-10-CM

## 2021-01-14 MED ORDER — ALBUTEROL SULFATE HFA 108 (90 BASE) MCG/ACT IN AERS: 2.0000 | INHALATION_SPRAY | Freq: Four times a day (QID) | RESPIRATORY_TRACT | 0 refills | Status: DC | PRN

## 2021-01-14 MED ORDER — BENZONATATE 100 MG PO CAPS
100.0000 mg | ORAL_CAPSULE | Freq: Three times a day (TID) | ORAL | 0 refills | Status: DC | PRN
Start: 2021-01-14 — End: 2021-01-23

## 2021-01-14 NOTE — Progress Notes (Signed)
Ms. Jane Fletcher, Jane Fletcher are scheduled for a virtual visit with your provider today.    Just as we do with appointments in the office, we must obtain your consent to participate.  Your consent will be active for this visit and any virtual visit you may have with one of our providers in the next 365 days.    If you have a MyChart account, I can also send a copy of this consent to you electronically.  All virtual visits are billed to your insurance company just like a traditional visit in the office.  As this is a virtual visit, video technology does not allow for your provider to perform a traditional examination.  This may limit your provider's ability to fully assess your condition.  If your provider identifies any concerns that need to be evaluated in person or the need to arrange testing such as labs, EKG, etc, we will make arrangements to do so.    Although advances in technology are sophisticated, we cannot ensure that it will always work on either your end or our end.  If the connection with a video visit is poor, we may have to switch to a telephone visit.  With either a video or telephone visit, we are not always able to ensure that we have a secure connection.   I need to obtain your verbal consent now.   Are you willing to proceed with your visit today?   [name] has provided verbal consent on 01/14/2021 for a virtual visit (video or telephone).  Piedad Climes, PA-C 01/14/2021  8:18 AM  Virtual Visit via Video   I connected with patient on 01/14/21 at  8:30 AM EDT by a video enabled telemedicine application and verified that I am speaking with the correct person using two identifiers.  Location patient: Home Location provider: [facility] Persons participating in the virtual visit: Patient, Provider  I discussed the limitations of evaluation and management by telemedicine and the availability of in person appointments. The patient expressed understanding and agreed to  proceed.  Subjective:   HPI:   Patient presents via [facility] today c/o fatigue and URI symptoms. Notes that she began to feel slightly fatigued over the weekend. Yesterday she noted increased levels of fatigue with onset of body aches -- more than what she gets from time to time with her chronic  fibromyalgia. Notes last night she was wheezing in her sleep. Today with dry nagging cough, aching and chest tightness. Denies fever. Mild nasal congestion but nothing out of the ordinary for her seasonal allergies. Denies ear pain, sore throat. Denies recent travel. Denies known sick contact. No booster for COVID but had her initial 2 Pfizer vaccines in 2021. Is up-to-date on her annual influenza vaccine.   ROS:   See pertinent positives and negatives per HPI.  Patient Active Problem List   Diagnosis Date Noted  . Left Achilles tendinitis 02/08/2018  . Fibromyalgia 12/30/2014  . Obesity 12/30/2014    Social History   Tobacco Use  . Smoking status: Never Smoker  . Smokeless tobacco: Never Used  Substance Use Topics  . Alcohol use: No    Current Outpatient Medications:  .  cetirizine (ZYRTEC) 10 MG tablet, Take 1 tablet (10 mg total) by mouth daily., Disp: 90 tablet, Rfl: 3 .  Cholecalciferol (VITAMIN D3) 1.25 MG (50000 UT) CAPS, Take 1 weekly for 12 weeks, Disp: 12 capsule, Rfl: 0 .  cyclobenzaprine (FLEXERIL) 10 MG tablet, Take 1 tablet (10  mg total) by mouth 2 (two) times daily as needed for muscle spasms., Disp: 30 tablet, Rfl: 1 .  doxycycline (VIBRA-TABS) 100 MG tablet, Take 1 tablet (100 mg total) by mouth 2 (two) times daily., Disp: 20 tablet, Rfl: 0 .  Insulin Pen Needle (PEN NEEDLES) 31G X 6 MM MISC, Use daily for injection, Disp: 100 each, Rfl: 3 .  Liraglutide -Weight Management (SAXENDA) 18 MG/3ML SOPN, Start with 0.6 mg injected subcu daily.  Increase by 0.6 mg weekly to a goal dose of 3 mg, Disp: 15 mL, Rfl: 3 .  naproxen sodium (ANAPROX) 220 MG tablet, Take 220 mg by mouth  2 (two) times daily with a meal., Disp: , Rfl:   Allergies  Allergen Reactions  . Penicillins Hives    Objective:   There were no vitals taken for this visit.  Patient is well-developed, well-nourished in no acute distress.  Resting comfortably at home.  Head is normocephalic, atraumatic.  No labored breathing.  Speech is clear and coherent with logical content.  Patient is alert and oriented at baseline.   Assessment and Plan:   1. Suspected COVID-19 virus infection Patient sent for testing giving concern for COVID. Thankfully no alarm signs/symptoms present. She has been enrolled in monitoring program through MyChart and has been instructed to quarantine until results are in and next steps are determined. Will have her increase fluids and rest. Start Mucinex-Dm. Rx tessalon for cough. Albuterol as directed when needed for chest tightness. She is already taking daily Vitamin D supplement. Recommended she add-on a daily Vitamin C and zinc supplement as well. Other OTC medications and supportive measures reviewed. Strict ER precautions discussed. Patient voiced understanding and agreement with the plan. A written copy of instructions was provided to the patient via MyChart. A copy of this note will be sent to her PCP, Dr. Shanda Bumps Copland, upon closure of this note.   - albuterol (VENTOLIN HFA) 108 (90 Base) MCG/ACT inhaler; Inhale 2 puffs into the lungs every 6 (six) hours as needed for wheezing or shortness of breath.  Dispense: 8 g; Refill: 0 - benzonatate (TESSALON) 100 MG capsule; Take 1 capsule (100 mg total) by mouth 3 (three) times daily as needed for cough.  Dispense: 30 capsule; Refill: 0 - MyChart COVID-19 home monitoring program; Future .   Piedad Climes, PA-C 01/14/2021

## 2021-01-14 NOTE — Patient Instructions (Signed)
Instructions sent to patients MyChart.

## 2021-01-23 ENCOUNTER — Telehealth: Payer: 59 | Admitting: Physician Assistant

## 2021-01-23 DIAGNOSIS — B9689 Other specified bacterial agents as the cause of diseases classified elsewhere: Secondary | ICD-10-CM

## 2021-01-23 DIAGNOSIS — J019 Acute sinusitis, unspecified: Secondary | ICD-10-CM

## 2021-01-23 DIAGNOSIS — J40 Bronchitis, not specified as acute or chronic: Secondary | ICD-10-CM | POA: Diagnosis not present

## 2021-01-23 MED ORDER — BENZONATATE 100 MG PO CAPS
100.0000 mg | ORAL_CAPSULE | Freq: Three times a day (TID) | ORAL | 0 refills | Status: DC | PRN
Start: 2021-01-23 — End: 2022-01-12

## 2021-01-23 MED ORDER — PREDNISONE 10 MG (21) PO TBPK
ORAL_TABLET | ORAL | 0 refills | Status: DC
Start: 2021-01-23 — End: 2022-01-12

## 2021-01-23 MED ORDER — DOXYCYCLINE HYCLATE 100 MG PO TABS: 100.0000 mg | ORAL_TABLET | Freq: Two times a day (BID) | ORAL | 0 refills | Status: DC

## 2021-01-23 NOTE — Progress Notes (Signed)
We are sorry that you are not feeling well.  Here is how we plan to help!  Based on what you have shared with me it looks like you have sinusitis.  Sinusitis is inflammation and infection in the sinus cavities of the head.  Based on your presentation I believe you most likely have Acute Bacterial Sinusitis.  This is an infection caused by bacteria and is treated with antibiotics. I have prescribed Doxycycline 100mg  by mouth twice a day for 10 days. You may use an oral decongestant such as Mucinex D or if you have glaucoma or high blood pressure use plain Mucinex. Saline nasal spray help and can safely be used as often as needed for congestion.  If you develop worsening sinus pain, fever or notice severe headache and vision changes, or if symptoms are not better after completion of antibiotic, please schedule an appointment with a health care provider.    Sinus infections are not as easily transmitted as other respiratory infection, however we still recommend that you avoid close contact with loved ones, especially the very young and elderly.  Remember to wash your hands thoroughly throughout the day as this is the number one way to prevent the spread of infection!  Home Care: Only take medications as instructed by your medical team. Complete the entire course of an antibiotic. Do not take these medications with alcohol. A steam or ultrasonic humidifier can help congestion.  You can place a towel over your head and breathe in the steam from hot water coming from a faucet. Avoid close contacts especially the very young and the elderly. Cover your mouth when you cough or sneeze. Always remember to wash your hands.  Get Help Right Away If: You develop worsening fever or sinus pain. You develop a severe head ache or visual changes. Your symptoms persist after you have completed your treatment plan.  Make sure you Understand these instructions. Will watch your condition. Will get help right away if  you are not doing well or get worse. We are sorry that you are not feeling well.  Here is how we plan to help!  Based on your presentation I believe you most likely have bacterial bronchitis as well. The Doxycycline prescribed above should help this as well. Continue the tessalon perles and inhaler. I will refill the tessalon perles.    In addition you may use A prescription cough medication called Tessalon Perles 100mg . You may take 1-2 capsules every 8 hours as needed for your cough.  Prednisone 10 mg daily for 6 days (see taper instructions below)  Directions for 6 day taper: Day 1: 2 tablets before breakfast, 1 after both lunch & dinner and 2 at bedtime Day 2: 1 tab before breakfast, 1 after both lunch & dinner and 2 at bedtime Day 3: 1 tab at each meal & 1 at bedtime Day 4: 1 tab at breakfast, 1 at lunch, 1 at bedtime Day 5: 1 tab at breakfast & 1 tab at bedtime Day 6: 1 tab at breakfast  From your responses in the eVisit questionnaire you describe inflammation in the upper respiratory tract which is causing a significant cough.  This is commonly called Bronchitis and has four common causes:   Allergies Viral Infections Acid Reflux Bacterial Infection Allergies, viruses and acid reflux are treated by controlling symptoms or eliminating the cause. An example might be a cough caused by taking certain blood pressure medications. You stop the cough by changing the medication. Another example might  be a cough caused by acid reflux. Controlling the reflux helps control the cough.  USE OF BRONCHODILATOR ("RESCUE") INHALERS: There is a risk from using your bronchodilator too frequently.  The risk is that over-reliance on a medication which only relaxes the muscles surrounding the breathing tubes can reduce the effectiveness of medications prescribed to reduce swelling and congestion of the tubes themselves.  Although you feel brief relief from the bronchodilator inhaler, your asthma may  actually be worsening with the tubes becoming more swollen and filled with mucus.  This can delay other crucial treatments, such as oral steroid medications. If you need to use a bronchodilator inhaler daily, several times per day, you should discuss this with your provider.  There are probably better treatments that could be used to keep your asthma under control.     HOME CARE Only take medications as instructed by your medical team. Complete the entire course of an antibiotic. Drink plenty of fluids and get plenty of rest. Avoid close contacts especially the very young and the elderly Cover your mouth if you cough or cough into your sleeve. Always remember to wash your hands A steam or ultrasonic humidifier can help congestion.   GET HELP RIGHT AWAY IF: You develop worsening fever. You become short of breath You cough up blood. Your symptoms persist after you have completed your treatment plan MAKE SURE YOU  Understand these instructions. Will watch your condition. Will get help right away if you are not doing well or get worse.  Your e-visit answers were reviewed by a board certified advanced clinical practitioner to complete your personal care plan.  Depending on the condition, your plan could have included both over the counter or prescription medications. If there is a problem please reply  once you have received a response from your provider. Your safety is important to [place].  If you have drug allergies check your prescription carefully.    You can use MyChart to ask questions about today's visit, request a non-urgent call back, or ask for a work or school excuse for 24 hours related to this e-Visit. If it has been greater than 24 hours you will need to follow up with your provider, or enter a new e-Visit to address those concerns. You will get an e-mail in the next two days asking about your experience.  I hope that your e-visit has been valuable and will speed your recovery. Thank  you for using e-visits.  I provided 6 minutes of non face-to-face time during this encounter for chart review and documentation.

## 2021-02-26 ENCOUNTER — Encounter: Payer: Self-pay | Admitting: Family Medicine

## 2021-02-26 DIAGNOSIS — M797 Fibromyalgia: Secondary | ICD-10-CM

## 2021-02-26 MED ORDER — CYCLOBENZAPRINE HCL 10 MG PO TABS: 10.0000 mg | ORAL_TABLET | Freq: Two times a day (BID) | ORAL | 1 refills | Status: DC | PRN

## 2021-04-22 ENCOUNTER — Ambulatory Visit: Payer: Self-pay

## 2021-04-22 ENCOUNTER — Other Ambulatory Visit: Payer: Self-pay | Admitting: Family Medicine

## 2021-04-22 ENCOUNTER — Other Ambulatory Visit: Payer: Self-pay

## 2021-04-22 DIAGNOSIS — M25562 Pain in left knee: Secondary | ICD-10-CM

## 2021-12-17 IMAGING — DX DG KNEE COMPLETE 4+V*L*
4 series · 4 of 4 positions shown · non-contrast
Comparison: 06/13/2017.

CLINICAL DATA: Left knee pain after a fall on 04/16/2021. Initial
encounter.

EXAM:
LEFT KNEE - COMPLETE 4+ VIEW

[knee pa]
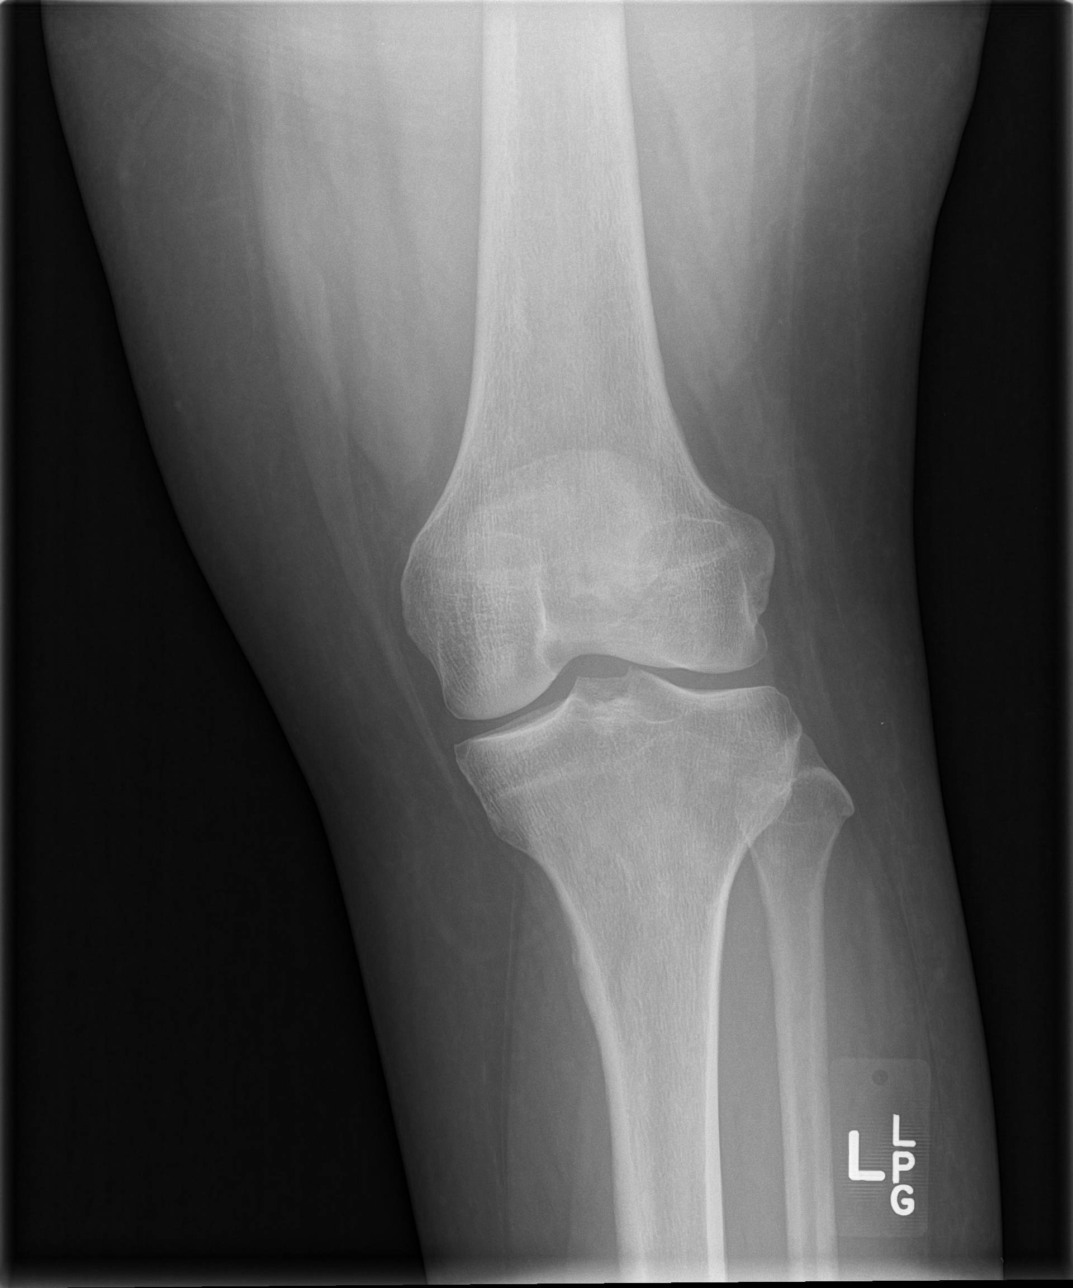

[knee obl (1 of 2)]
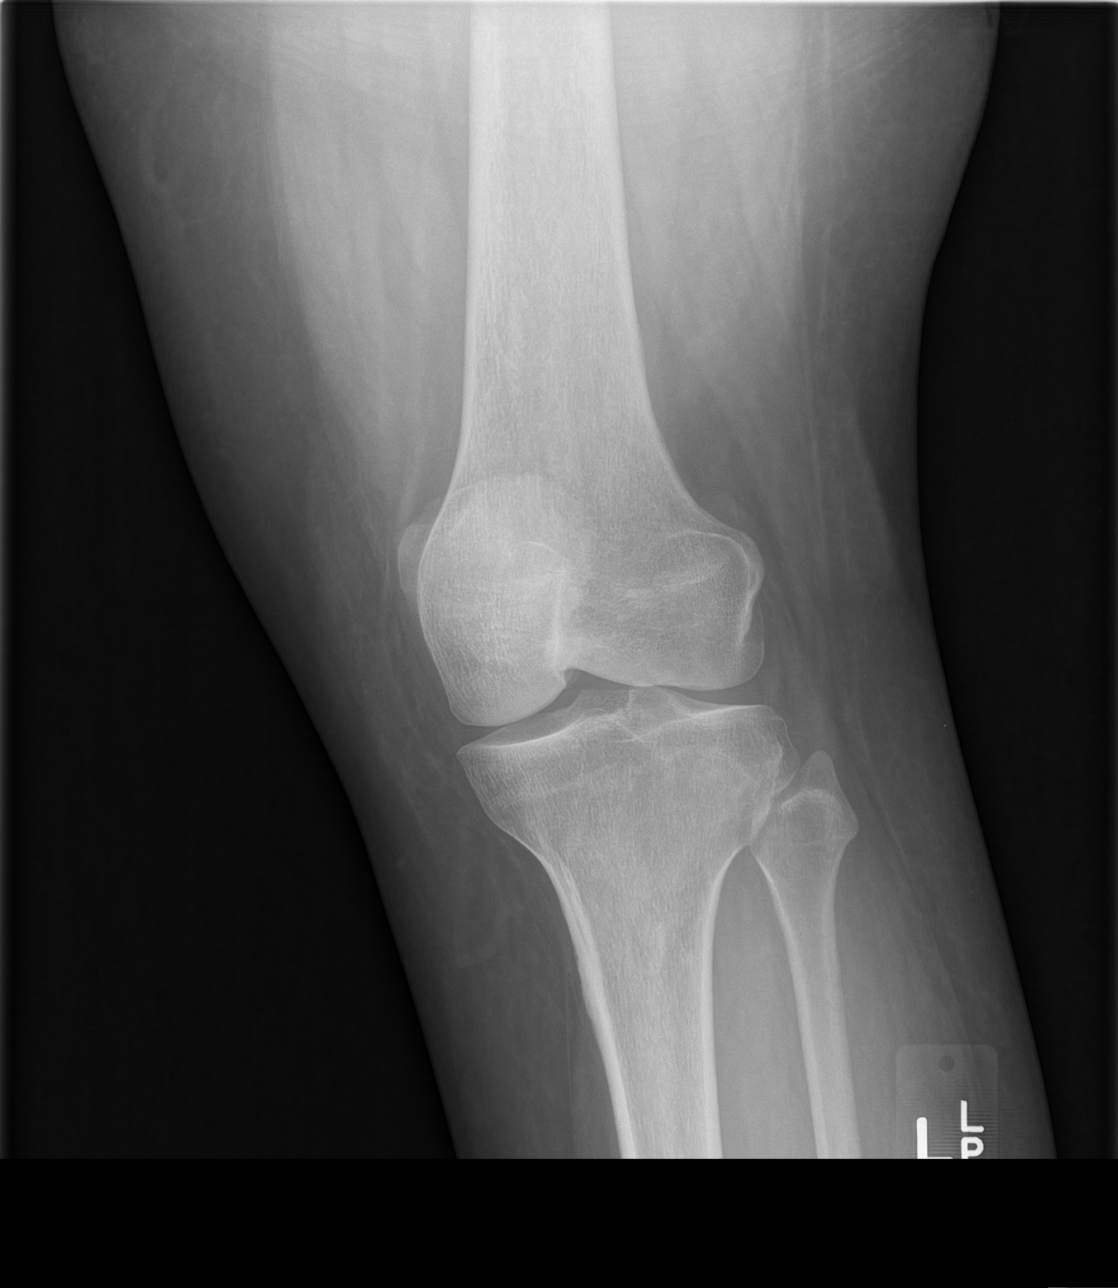

[knee obl (2 of 2)]
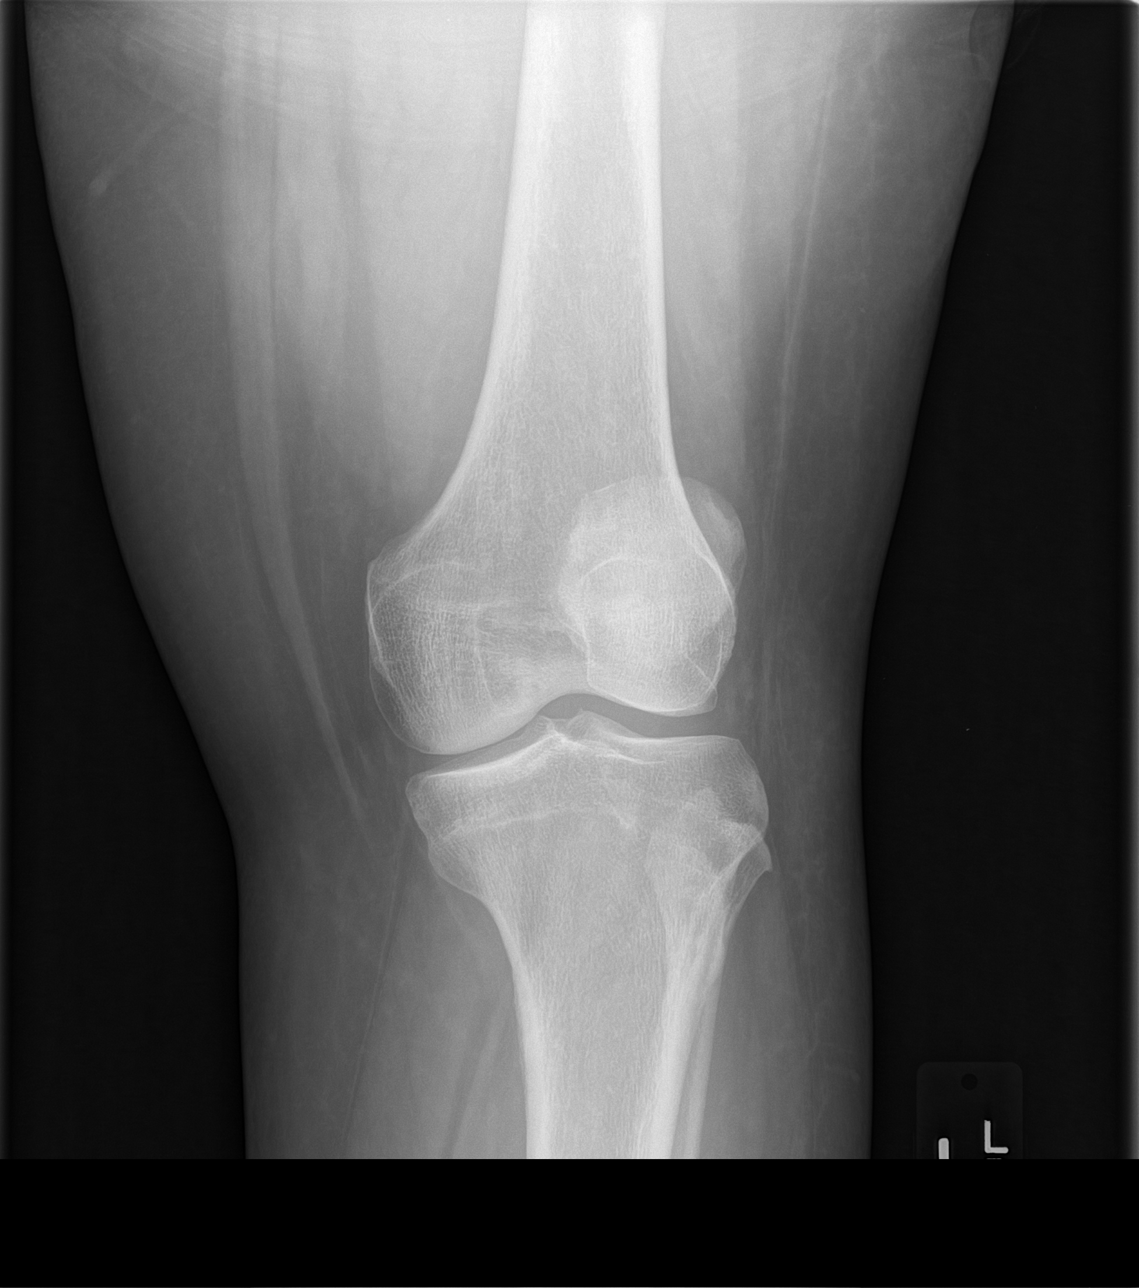

[knee lat]
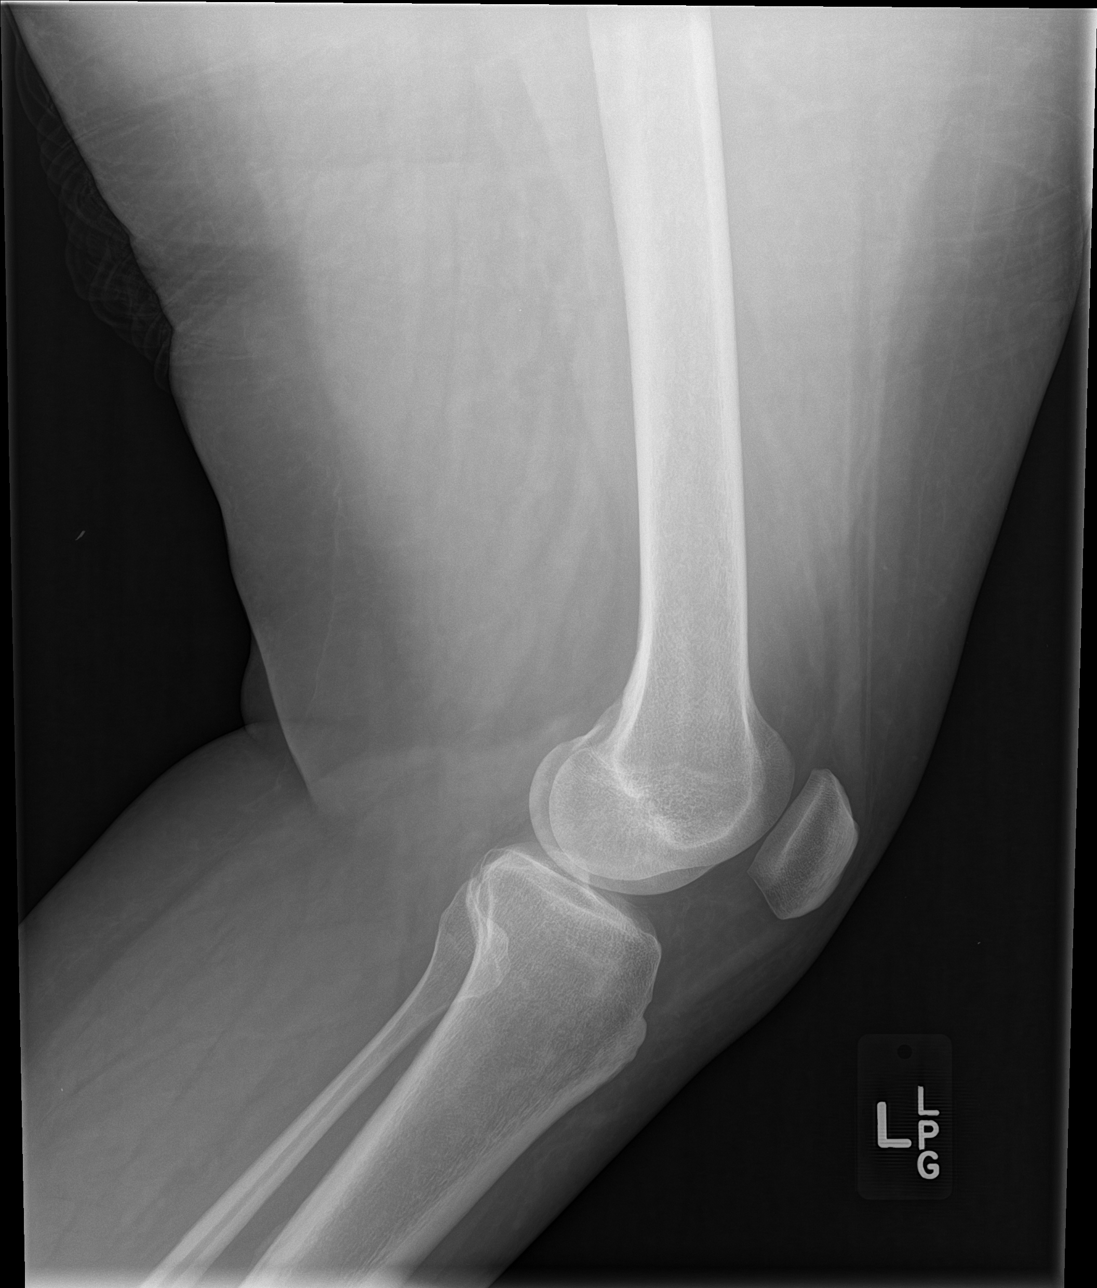

[4 of 4 positions shown; findings below may reference images not displayed]

FINDINGS: No acute findings.
IMPRESSION: Negative.

## 2022-01-12 ENCOUNTER — Encounter: Payer: Self-pay | Admitting: Family Medicine

## 2022-01-12 ENCOUNTER — Telehealth: Payer: BC Managed Care – PPO | Admitting: Family Medicine

## 2022-01-12 DIAGNOSIS — M797 Fibromyalgia: Secondary | ICD-10-CM

## 2022-01-12 DIAGNOSIS — J40 Bronchitis, not specified as acute or chronic: Secondary | ICD-10-CM

## 2022-01-12 MED ORDER — AZITHROMYCIN 250 MG PO TABS: ORAL_TABLET | ORAL | 0 refills | Status: DC

## 2022-01-12 MED ORDER — CYCLOBENZAPRINE HCL 10 MG PO TABS: 10.0000 mg | ORAL_TABLET | Freq: Two times a day (BID) | ORAL | 0 refills | Status: DC | PRN

## 2022-01-12 MED ORDER — PREDNISONE 10 MG PO TABS: ORAL_TABLET | ORAL | 0 refills | Status: DC

## 2022-01-12 NOTE — Assessment & Plan Note (Signed)
pred taper  con't albuterol prn z pak and f/u pcp 1-2 weeks

## 2022-01-12 NOTE — Progress Notes (Signed)
MyChart Video Visit    Virtual Visit via Video Note   This visit type was conducted due to national recommendations for restrictions regarding the COVID-19 Pandemic (e.g. social distancing) in an effort to limit this patient's exposure and mitigate transmission in our community. This patient is at least at moderate risk for complications without adequate follow up. This format is felt to be most appropriate for this patient at this time. Physical exam was limited by quality of the video and audio technology used for the visit. Nira Conn  was able to get the patient set up on a video visit.  Patient location: Home Patient and provider in visit Provider location: Office  I discussed the limitations of evaluation and management by telemedicine and the availability of in person appointments. The patient expressed understanding and agreed to proceed.  Visit Date: 01/12/2022  Today's healthcare provider: Ann Held, DO     Subjective:    Patient ID: Jane Fletcher, female    DOB: August 26, 1975, 46 y.o.   MRN: [id number]  Chief Complaint  Patient presents with   Cough    Cough Associated symptoms include myalgias (aching pain) and wheezing. Pertinent negatives include no chest pain, fever, headaches, rash or shortness of breath.  Patient is in today for a video visit.   She complains of coughing and wheezing for the past week. Her cough started last Thursday, 01/07/2022. She also has clear mild sputum production and post nasal drip. Her cough is keeping her up at night. She is taking her albuterol inhaler to manage her symptoms. She has not taken a Covid-19 test. She has an at home test kit but it is expired.  She reports having aching pain from her fibromyalgia which is worsened by her current symptoms. She is requesting a refill on 10 mg flexeril.    Past Medical History:  Diagnosis Date   Allergy    Anxiety    Fibromyalgia     Past Surgical History:  Procedure  Laterality Date   CHOLECYSTECTOMY      Family History  Problem Relation Age of Onset   Hypertension Mother    Mental illness Sister    Migraines Sister     Social History   Socioeconomic History   Marital status: Married    Spouse name: Not on file   Number of children: Not on file   Years of education: Not on file   Highest education level: Not on file  Occupational History   Not on file  Tobacco Use   Smoking status: Never   Smokeless tobacco: Never  Vaping Use   Vaping Use: Never used  Substance and Sexual Activity   Alcohol use: No   Drug use: No   Sexual activity: Never  Other Topics Concern   Not on file  Social History Narrative   Not on file   Social Determinants of Health   Financial Resource Strain: Not on file  Food Insecurity: Not on file  Transportation Needs: Not on file  Physical Activity: Not on file  Stress: Not on file  Social Connections: Not on file  Intimate Partner Violence: Not on file    Outpatient Medications Prior to Visit  Medication Sig Dispense Refill   albuterol (VENTOLIN HFA) 108 (90 Base) MCG/ACT inhaler Inhale 2 puffs into the lungs every 6 (six) hours as needed for wheezing or shortness of breath. 8 g 0   cetirizine (ZYRTEC) 10 MG tablet Take 1 tablet (10 mg total) by  mouth daily. 90 tablet 3   Cholecalciferol (VITAMIN D) 50 MCG (2000 UT) CAPS Take by mouth.     naproxen sodium (ANAPROX) 220 MG tablet Take 220 mg by mouth 2 (two) times daily with a meal.     benzonatate (TESSALON) 100 MG capsule Take 1 capsule (100 mg total) by mouth 3 (three) times daily as needed. 30 capsule 0   cyclobenzaprine (FLEXERIL) 10 MG tablet Take 1 tablet (10 mg total) by mouth 2 (two) times daily as needed for muscle spasms. 30 tablet 1   doxycycline (VIBRA-TABS) 100 MG tablet Take 1 tablet (100 mg total) by mouth 2 (two) times daily. 20 tablet 0   predniSONE (STERAPRED UNI-PAK 21 TAB) 10 MG (21) TBPK tablet 6 day taper; take as directed on package  instructions 21 tablet 0   No facility-administered medications prior to visit.    Allergies  Allergen Reactions   Penicillins Hives    Review of Systems  Constitutional:  Negative for fever and malaise/fatigue.  HENT:  Negative for congestion.        (+)post-nasal drip  Eyes:  Negative for blurred vision.  Respiratory:  Positive for cough, sputum production (clear) and wheezing. Negative for shortness of breath.   Cardiovascular:  Negative for chest pain, palpitations and leg swelling.  Gastrointestinal:  Negative for vomiting.  Musculoskeletal:  Positive for myalgias (aching pain). Negative for back pain.  Skin:  Negative for rash.  Neurological:  Negative for loss of consciousness and headaches.      Objective:    Physical Exam Vitals and nursing note reviewed.  Constitutional:      Appearance: She is well-developed.  HENT:     Head: Normocephalic and atraumatic.  Eyes:     Conjunctiva/sclera: Conjunctivae normal.  Neck:     Thyroid: No thyromegaly.     Vascular: No carotid bruit or JVD.  Cardiovascular:     Rate and Rhythm: Normal rate and regular rhythm.     Heart sounds: Normal heart sounds. No murmur heard. Pulmonary:     Effort: Pulmonary effort is normal. No respiratory distress.     Breath sounds: Normal breath sounds. No wheezing or rales.  Chest:     Chest wall: No tenderness.  Musculoskeletal:     Cervical back: Normal range of motion and neck supple.  Neurological:     Mental Status: She is alert and oriented to person, place, and time.    There were no vitals taken for this visit. Wt Readings from Last 3 Encounters:  06/18/20 240 lb (108.9 kg)  02/09/19 219 lb (99.3 kg)  01/15/19 223 lb 2 oz (101.2 kg)    Diabetic Foot Exam - Simple   No data filed    Lab Results  Component Value Date   WBC 6.2 06/18/2020   HGB 11.9 06/18/2020   HCT 34.4 (L) 06/18/2020   PLT 236 06/18/2020   GLUCOSE 106 (H) 06/18/2020   CHOL 217 (H) 06/18/2020   TRIG  232 (H) 06/18/2020   HDL 46 (L) 06/18/2020   LDLDIRECT 129.0 02/01/2018   LDLCALC 134 (H) 06/18/2020   ALT 7 06/18/2020   AST 11 06/18/2020   NA 142 06/18/2020   K 4.1 06/18/2020   CL 104 06/18/2020   CREATININE 0.70 06/18/2020   BUN 13 06/18/2020   CO2 27 06/18/2020   TSH 0.65 06/18/2020   HGBA1C 5.1 06/18/2020    Lab Results  Component Value Date   TSH 0.65 06/18/2020   Lab  Results  Component Value Date   WBC 6.2 06/18/2020   HGB 11.9 06/18/2020   HCT 34.4 (L) 06/18/2020   MCV 93.2 06/18/2020   PLT 236 06/18/2020   Lab Results  Component Value Date   NA 142 06/18/2020   K 4.1 06/18/2020   CO2 27 06/18/2020   GLUCOSE 106 (H) 06/18/2020   BUN 13 06/18/2020   CREATININE 0.70 06/18/2020   BILITOT 0.4 06/18/2020   ALKPHOS 49 02/01/2018   AST 11 06/18/2020   ALT 7 06/18/2020   PROT 7.2 06/18/2020   ALBUMIN 4.1 02/01/2018   CALCIUM 9.5 06/18/2020   ANIONGAP 6 04/28/2016   GFR 117.33 02/01/2018   Lab Results  Component Value Date   CHOL 217 (H) 06/18/2020   Lab Results  Component Value Date   HDL 46 (L) 06/18/2020   Lab Results  Component Value Date   LDLCALC 134 (H) 06/18/2020   Lab Results  Component Value Date   TRIG 232 (H) 06/18/2020   Lab Results  Component Value Date   CHOLHDL 4.7 06/18/2020   Lab Results  Component Value Date   HGBA1C 5.1 06/18/2020       Assessment & Plan:   Problem List Items Addressed This Visit       Unprioritized   Fibromyalgia   Relevant Medications   predniSONE (DELTASONE) 10 MG tablet   cyclobenzaprine (FLEXERIL) 10 MG tablet   Bronchitis - Primary    pred taper  con't albuterol prn z pak and f/u pcp 1-2 weeks        Relevant Medications   azithromycin (ZITHROMAX Z-PAK) 250 MG tablet   predniSONE (DELTASONE) 10 MG tablet     Meds ordered this encounter  Medications   azithromycin (ZITHROMAX Z-PAK) 250 MG tablet    Sig: As directed    Dispense:  6 each    Refill:  0   predniSONE (DELTASONE)  10 MG tablet    Sig: TAKE 3 TABLETS PO QD FOR 3 DAYS THEN TAKE 2 TABLETS PO QD FOR 3 DAYS THEN TAKE 1 TABLET PO QD FOR 3 DAYS THEN TAKE 1/2 TAB PO QD FOR 3 DAYS    Dispense:  20 tablet    Refill:  0   cyclobenzaprine (FLEXERIL) 10 MG tablet    Sig: Take 1 tablet (10 mg total) by mouth 2 (two) times daily as needed for muscle spasms.    Dispense:  30 tablet    Refill:  0    I discussed the assessment and treatment plan with the patient. The patient was provided an opportunity to ask questions and all were answered. The patient agreed with the plan and demonstrated an understanding of the instructions.   The patient was advised to call back or seek an in-person evaluation if the symptoms worsen or if the condition fails to improve as anticipated.  I,Shehryar Baig,acting as a Education administrator for [employer], DO.,have documented all relevant documentation on the behalf of Ann Held, DO,as directed by  Ann Held, DO while in the presence of Ann Held, DO.  I provided 20 minutes of face-to-face time during this encounter.   Ann Held, DO [place] [facility] [employer] [facility] [phone] (phone) [phone] (fax)  [place]

## 2022-01-20 ENCOUNTER — Telehealth: Payer: BC Managed Care – PPO | Admitting: Physician Assistant

## 2022-01-20 DIAGNOSIS — J069 Acute upper respiratory infection, unspecified: Secondary | ICD-10-CM

## 2022-01-20 NOTE — Progress Notes (Signed)
Because of ongoing symptoms and weakness despite previous treatment, I feel your condition warrants further evaluation and I recommend that you be seen in a face to face visit.   NOTE: There will be NO CHARGE for this eVisit   If you are having a true medical emergency please call 911.      For an urgent face to face visit, [facility] has six urgent care centers for your convenience:     [facility] at [facility] Directions [phone] [address] [place], [place] [address]    [facility]) Get Driving Directions [phone] [address] [place], [place] [address]  [facility]) Get Driving Directions [phone] [address] [place],  [place]  [address]  [facility] at [facility] Get Driving Directions [phone] [address] [place], [place] [address]   [facility] at [facility] Get Driving Directions  [phone] [address] [place], [place] [address]   [facility] at [facility] Directions [phone] [address] [place], [place] [address]  Your MyChart E-visit questionnaire answers were reviewed by a board certified advanced clinical practitioner to complete your personal care plan based on your specific symptoms.  Thank you for using e-Visits.

## 2022-01-21 ENCOUNTER — Ambulatory Visit (INDEPENDENT_AMBULATORY_CARE_PROVIDER_SITE_OTHER): Payer: BC Managed Care – PPO | Admitting: Family Medicine

## 2022-01-21 VITALS — BP 140/80 | HR 114 | Temp 98.5°F | Resp 16 | Ht 63.0 in | Wt 248.6 lb

## 2022-01-21 DIAGNOSIS — J40 Bronchitis, not specified as acute or chronic: Secondary | ICD-10-CM

## 2022-01-21 DIAGNOSIS — J4541 Moderate persistent asthma with (acute) exacerbation: Secondary | ICD-10-CM | POA: Diagnosis not present

## 2022-01-21 MED ORDER — TRELEGY ELLIPTA 100-62.5-25 MCG/ACT IN AEPB: 1.0000 | INHALATION_SPRAY | Freq: Every day | RESPIRATORY_TRACT | 11 refills | Status: DC

## 2022-01-21 MED ORDER — DOXYCYCLINE HYCLATE 100 MG PO TABS: 100.0000 mg | ORAL_TABLET | Freq: Two times a day (BID) | ORAL | 0 refills | Status: DC

## 2022-01-21 NOTE — Progress Notes (Signed)
Subjective:   By signing my name below, I, Shehryar Baig, attest that this documentation has been prepared under the direction and in the presence of Ann Held, DO  01/21/2022    Patient ID: Jane Fletcher, female    DOB: 1976-07-25, 46 y.o.   MRN: [id number]  Chief Complaint  Patient presents with   Bronchitis    Here for Bronchitis     HPI Patient is in today for a office visit.  She continues complaining of cough. During last visit she was given a prednisone taper and azithromycin to manage her symptoms and found no change in her symptoms. She continues using her albuterol inhalers. She does not use them daily and only when her breathing worsens. She tested negative for Covid-19. She has a history of developing similar symptoms due to seasonal changes but finds her current symptoms have lasted longest.    Past Medical History:  Diagnosis Date   Allergy    Anxiety    Fibromyalgia     Past Surgical History:  Procedure Laterality Date   CHOLECYSTECTOMY      Family History  Problem Relation Age of Onset   Hypertension Mother    Mental illness Sister    Migraines Sister     Social History   Socioeconomic History   Marital status: Married    Spouse name: Not on file   Number of children: Not on file   Years of education: Not on file   Highest education level: Not on file  Occupational History   Not on file  Tobacco Use   Smoking status: Never   Smokeless tobacco: Never  Vaping Use   Vaping Use: Never used  Substance and Sexual Activity   Alcohol use: No   Drug use: No   Sexual activity: Never  Other Topics Concern   Not on file  Social History Narrative   Not on file   Social Determinants of Health   Financial Resource Strain: Not on file  Food Insecurity: Not on file  Transportation Needs: Not on file  Physical Activity: Not on file  Stress: Not on file  Social Connections: Not on file  Intimate Partner Violence: Not on file     Outpatient Medications Prior to Visit  Medication Sig Dispense Refill   albuterol (VENTOLIN HFA) 108 (90 Base) MCG/ACT inhaler Inhale 2 puffs into the lungs every 6 (six) hours as needed for wheezing or shortness of breath. 8 g 0   azithromycin (ZITHROMAX Z-PAK) 250 MG tablet As directed 6 each 0   cetirizine (ZYRTEC) 10 MG tablet Take 1 tablet (10 mg total) by mouth daily. 90 tablet 3   Cholecalciferol (VITAMIN D) 50 MCG (2000 UT) CAPS Take by mouth.     cyclobenzaprine (FLEXERIL) 10 MG tablet Take 1 tablet (10 mg total) by mouth 2 (two) times daily as needed for muscle spasms. 30 tablet 0   naproxen sodium (ANAPROX) 220 MG tablet Take 220 mg by mouth 2 (two) times daily with a meal.     predniSONE (DELTASONE) 10 MG tablet TAKE 3 TABLETS PO QD FOR 3 DAYS THEN TAKE 2 TABLETS PO QD FOR 3 DAYS THEN TAKE 1 TABLET PO QD FOR 3 DAYS THEN TAKE 1/2 TAB PO QD FOR 3 DAYS 20 tablet 0   No facility-administered medications prior to visit.    Allergies  Allergen Reactions   Penicillins Hives    Review of Systems  Constitutional:  Negative for fever and malaise/fatigue.  HENT:  Negative for congestion.   Eyes:  Negative for blurred vision.  Respiratory:  Positive for cough. Negative for shortness of breath.   Cardiovascular:  Negative for chest pain, palpitations and leg swelling.  Gastrointestinal:  Negative for vomiting.  Musculoskeletal:  Negative for back pain.  Skin:  Negative for rash.  Neurological:  Negative for loss of consciousness and headaches.       Objective:    Physical Exam Vitals and nursing note reviewed.  Constitutional:      General: She is not in acute distress.    Appearance: Normal appearance. She is well-developed. She is not ill-appearing.  HENT:     Head: Normocephalic and atraumatic.     Right Ear: External ear normal.     Left Ear: External ear normal.  Eyes:     Extraocular Movements: Extraocular movements intact.     Conjunctiva/sclera: Conjunctivae  normal.     Pupils: Pupils are equal, round, and reactive to light.  Neck:     Thyroid: No thyromegaly.     Vascular: No carotid bruit or JVD.  Cardiovascular:     Rate and Rhythm: Normal rate and regular rhythm.     Heart sounds: Normal heart sounds. No murmur heard.    No gallop.  Pulmonary:     Effort: Pulmonary effort is normal. No respiratory distress.     Breath sounds: Decreased breath sounds present. No wheezing or rales.  Chest:     Chest wall: No tenderness.  Musculoskeletal:     Cervical back: Normal range of motion and neck supple.  Skin:    General: Skin is warm and dry.  Neurological:     Mental Status: She is alert and oriented to person, place, and time.  Psychiatric:        Judgment: Judgment normal.     BP 140/80 (BP Location: Right Arm, Patient Position: Sitting, Cuff Size: Normal)   Pulse (!) 114   Temp 98.5 F (36.9 C) (Oral)   Resp 16   Ht 5\' 3"  (1.6 m)   Wt 248 lb 9.6 oz (112.8 kg)   SpO2 97%   BMI 44.04 kg/m  Wt Readings from Last 3 Encounters:  01/21/22 248 lb 9.6 oz (112.8 kg)  06/18/20 240 lb (108.9 kg)  02/09/19 219 lb (99.3 kg)    Diabetic Foot Exam - Simple   No data filed    Lab Results  Component Value Date   WBC 6.2 06/18/2020   HGB 11.9 06/18/2020   HCT 34.4 (L) 06/18/2020   PLT 236 06/18/2020   GLUCOSE 106 (H) 06/18/2020   CHOL 217 (H) 06/18/2020   TRIG 232 (H) 06/18/2020   HDL 46 (L) 06/18/2020   LDLDIRECT 129.0 02/01/2018   LDLCALC 134 (H) 06/18/2020   ALT 7 06/18/2020   AST 11 06/18/2020   NA 142 06/18/2020   K 4.1 06/18/2020   CL 104 06/18/2020   CREATININE 0.70 06/18/2020   BUN 13 06/18/2020   CO2 27 06/18/2020   TSH 0.65 06/18/2020   HGBA1C 5.1 06/18/2020    Lab Results  Component Value Date   TSH 0.65 06/18/2020   Lab Results  Component Value Date   WBC 6.2 06/18/2020   HGB 11.9 06/18/2020   HCT 34.4 (L) 06/18/2020   MCV 93.2 06/18/2020   PLT 236 06/18/2020   Lab Results  Component Value Date    NA 142 06/18/2020   K 4.1 06/18/2020   CO2 27 06/18/2020  GLUCOSE 106 (H) 06/18/2020   BUN 13 06/18/2020   CREATININE 0.70 06/18/2020   BILITOT 0.4 06/18/2020   ALKPHOS 49 02/01/2018   AST 11 06/18/2020   ALT 7 06/18/2020   PROT 7.2 06/18/2020   ALBUMIN 4.1 02/01/2018   CALCIUM 9.5 06/18/2020   ANIONGAP 6 04/28/2016   GFR 117.33 02/01/2018   Lab Results  Component Value Date   CHOL 217 (H) 06/18/2020   Lab Results  Component Value Date   HDL 46 (L) 06/18/2020   Lab Results  Component Value Date   LDLCALC 134 (H) 06/18/2020   Lab Results  Component Value Date   TRIG 232 (H) 06/18/2020   Lab Results  Component Value Date   CHOLHDL 4.7 06/18/2020   Lab Results  Component Value Date   HGBA1C 5.1 06/18/2020       Assessment & Plan:   Problem List Items Addressed This Visit       Unprioritized   Bronchitis    abx per orders and cough meds  F/u prn       Relevant Medications   doxycycline (VIBRA-TABS) 100 MG tablet   Other Visit Diagnoses     Moderate persistent asthma with acute exacerbation    -  Primary   Relevant Medications   Fluticasone-Umeclidin-Vilant (TRELEGY ELLIPTA) 100-62.5-25 MCG/ACT AEPB   doxycycline (VIBRA-TABS) 100 MG tablet        Meds ordered this encounter  Medications   Fluticasone-Umeclidin-Vilant (TRELEGY ELLIPTA) 100-62.5-25 MCG/ACT AEPB    Sig: Inhale 1 puff into the lungs daily.    Dispense:  1 each    Refill:  11   doxycycline (VIBRA-TABS) 100 MG tablet    Sig: Take 1 tablet (100 mg total) by mouth 2 (two) times daily.    Dispense:  20 tablet    Refill:  0    I, Ann Held, DO, personally preformed the services described in this documentation.  All medical record entries made by the scribe were at my direction and in my presence.  I have reviewed the chart and discharge instructions (if applicable) and agree that the record reflects my personal performance and is accurate and complete.  01/21/2022   I,Shehryar Baig,acting as a scribe for Ann Held, DO.,have documented all relevant documentation on the behalf of Ann Held, DO,as directed by  Ann Held, DO while in the presence of Ann Held, DO.   Ann Held, DO

## 2022-01-21 NOTE — Patient Instructions (Signed)
Acute Bronchitis, Adult ? ?Acute bronchitis is sudden inflammation of the main airways (bronchi) that come off the windpipe (trachea) in the lungs. The swelling causes the airways to get smaller and make more mucus than normal. This can make it hard to breathe and can cause coughing or noisy breathing (wheezing). ?Acute bronchitis may last several weeks. The cough may last longer. Allergies, asthma, and exposure to smoke may make the condition worse. ?What are the causes? ?This condition can be caused by germs and by substances that irritate the lungs, including: ?Cold and flu viruses. The most common cause of this condition is the virus that causes the common cold. ?Bacteria. This is less common. ?Breathing in substances that irritate the lungs, including: ?Smoke from cigarettes and other forms of tobacco. ?Dust and pollen. ?Fumes from household cleaning products, gases, or burned fuel. ?Indoor or outdoor air pollution. ?What increases the risk? ?The following factors may make you more likely to develop this condition: ?A weak body's defense system, also called the immune system. ?A condition that affects your lungs and breathing, such as asthma. ?What are the signs or symptoms? ?Common symptoms of this condition include: ?Coughing. This may bring up clear, yellow, or green mucus from your lungs (sputum). ?Wheezing. ?Runny or stuffy nose. ?Having too much mucus in your lungs (chest congestion). ?Shortness of breath. ?Aches and pains, including sore throat or chest. ?How is this diagnosed? ?This condition is usually diagnosed based on: ?Your symptoms and medical history. ?A physical exam. ?You may also have other tests, including tests to rule out other conditions, such as pneumonia. These tests include: ?A test of lung function. ?Test of a mucus sample to look for the presence of bacteria. ?Tests to check the oxygen level in your blood. ?Blood tests. ?Chest X-ray. ?How is this treated? ?Most cases of acute  bronchitis clear up over time without treatment. Your health care provider may recommend: ?Drinking more fluids to help thin your mucus so it is easier to cough up. ?Taking inhaled medicine (inhaler) to improve air flow in and out of your lungs. ?Using a vaporizer or a humidifier. These are machines that add water to the air to help you breathe better. ?Taking a medicine that thins mucus and clears congestion (expectorant). ?Taking a medicine that prevents or stops coughing (cough suppressant). ?It is notcommon to take an antibiotic medicine for this condition. ?Follow these instructions at home: ? ?Take over-the-counter and prescription medicines only as told by your health care provider. ?Use an inhaler, vaporizer, or humidifier as told by your health care provider. ?Take two teaspoons (10 mL) of honey at bedtime to lessen coughing at night. ?Drink enough fluid to keep your urine pale yellow. ?Do not use any products that contain nicotine or tobacco. These products include cigarettes, chewing tobacco, and vaping devices, such as e-cigarettes. If you need help quitting, ask your health care provider. ?Get plenty of rest. ?Return to your normal activities as told by your health care provider. Ask your health care provider what activities are safe for you. ?Keep all follow-up visits. This is important. ?How is this prevented? ?To lower your risk of getting this condition again: ?Wash your hands often with soap and water for at least 20 seconds. If soap and water are not available, use hand sanitizer. ?Avoid contact with people who have cold symptoms. ?Try not to touch your mouth, nose, or eyes with your hands. ?Avoid breathing in smoke or chemical fumes. Breathing smoke or chemical fumes will make your   condition worse. ?Get the flu shot every year. ?Contact a health care provider if: ?Your symptoms do not improve after 2 weeks. ?You have trouble coughing up the mucus. ?Your cough keeps you awake at night. ?You have a  fever. ?Get help right away if you: ?Cough up blood. ?Feel pain in your chest. ?Have severe shortness of breath. ?Faint or keep feeling like you are going to faint. ?Have a severe headache. ?Have a fever or chills that get worse. ?These symptoms may represent a serious problem that is an emergency. Do not wait to see if the symptoms will go away. Get medical help right away. Call your local emergency services (911 in the [place] Do not drive yourself to the hospital. ?Summary ?Acute bronchitis is inflammation of the main airways (bronchi) that come off the windpipe (trachea) in the lungs. The swelling causes the airways to get smaller and make more mucus than normal. ?Drinking more fluids can help thin your mucus so it is easier to cough up. ?Take over-the-counter and prescription medicines only as told by your health care provider. ?Do not use any products that contain nicotine or tobacco. These products include cigarettes, chewing tobacco, and vaping devices, such as e-cigarettes. If you need help quitting, ask your health care provider. ?Contact a health care provider if your symptoms do not improve after 2 weeks. ?This information is not intended to replace advice given to you by your health care provider. Make sure you discuss any questions you have with your health care provider. ?Document Revised: 12/03/2020 Document Reviewed: 12/03/2020 ?Elsevier Patient Education ? 2023 [employer] ? ?

## 2022-01-25 ENCOUNTER — Ambulatory Visit: Payer: BC Managed Care – PPO | Admitting: Family Medicine

## 2022-01-29 ENCOUNTER — Encounter: Payer: Self-pay | Admitting: Family Medicine

## 2022-01-29 NOTE — Assessment & Plan Note (Addendum)
abx per orders and cough meds  F/u prn  Finish pred

## 2022-02-06 NOTE — Progress Notes (Deleted)
[facility] [address] [place], [place] [address] [phone]  Date:  02/08/2022   Name:  Jane Fletcher   DOB:  12-Jul-1976   MRN:  [id number]  PCP:  Pearline Cables, MD    Chief Complaint: No chief complaint on file.   History of Present Illness:  Jane Fletcher is a 46 y.o. very pleasant female patient who presents with the following:  Pt seen today for follow-up Last seen by myself 11/21 for a CPE  Generally in good health  She was seen by Dr Laury Axon a couple of weeks ago with concern of asthma exacerbation - given doxycycline for bronchitis and started on Telelgy Ellipta inhaler   Patient Active Problem List   Diagnosis Date Noted   Bronchitis 01/12/2022   Left Achilles tendinitis 02/08/2018   Fibromyalgia 12/30/2014   Obesity 12/30/2014    Past Medical History:  Diagnosis Date   Allergy    Anxiety    Fibromyalgia     Past Surgical History:  Procedure Laterality Date   CHOLECYSTECTOMY      Social History   Tobacco Use   Smoking status: Never   Smokeless tobacco: Never  Vaping Use   Vaping Use: Never used  Substance Use Topics   Alcohol use: No   Drug use: No    Family History  Problem Relation Age of Onset   Hypertension Mother    Mental illness Sister    Migraines Sister     Allergies  Allergen Reactions   Penicillins Hives    Medication list has been reviewed and updated.  Current Outpatient Medications on File Prior to Visit  Medication Sig Dispense Refill   albuterol (VENTOLIN HFA) 108 (90 Base) MCG/ACT inhaler Inhale 2 puffs into the lungs every 6 (six) hours as needed for wheezing or shortness of breath. 8 g 0   azithromycin (ZITHROMAX Z-PAK) 250 MG tablet As directed 6 each 0   cetirizine (ZYRTEC) 10 MG tablet Take 1 tablet (10 mg total) by mouth daily. 90 tablet 3   Cholecalciferol (VITAMIN D) 50 MCG (2000 UT) CAPS Take by mouth.     cyclobenzaprine (FLEXERIL) 10  MG tablet Take 1 tablet (10 mg total) by mouth 2 (two) times daily as needed for muscle spasms. 30 tablet 0   doxycycline (VIBRA-TABS) 100 MG tablet Take 1 tablet (100 mg total) by mouth 2 (two) times daily. 20 tablet 0   Fluticasone-Umeclidin-Vilant (TRELEGY ELLIPTA) 100-62.5-25 MCG/ACT AEPB Inhale 1 puff into the lungs daily. 1 each 11   naproxen sodium (ANAPROX) 220 MG tablet Take 220 mg by mouth 2 (two) times daily with a meal.     predniSONE (DELTASONE) 10 MG tablet TAKE 3 TABLETS PO QD FOR 3 DAYS THEN TAKE 2 TABLETS PO QD FOR 3 DAYS THEN TAKE 1 TABLET PO QD FOR 3 DAYS THEN TAKE 1/2 TAB PO QD FOR 3 DAYS 20 tablet 0   No current facility-administered medications on file prior to visit.    Review of Systems:  As per HPI- otherwise negative.   Physical Examination: There were no vitals filed for this visit. There were no vitals filed for this visit. There is no height or weight on file to calculate BMI. Ideal Body Weight:    GEN: no acute distress. HEENT: Atraumatic, Normocephalic.  Ears and Nose: No external deformity. CV: RRR, No M/G/R. No JVD. No thrill. No extra heart sounds. PULM: CTA B, no  wheezes, crackles, rhonchi. No retractions. No resp. distress. No accessory muscle use. ABD: S, NT, ND, +BS. No rebound. No HSM. EXTR: No c/c/e PSYCH: Normally interactive. Conversant.    Assessment and Plan: ***  Signed Abbe Amsterdam, MD

## 2022-02-08 ENCOUNTER — Ambulatory Visit: Payer: BC Managed Care – PPO | Admitting: Family Medicine

## 2022-05-26 ENCOUNTER — Ambulatory Visit: Payer: BC Managed Care – PPO | Admitting: Family Medicine

## 2022-06-02 ENCOUNTER — Ambulatory Visit: Payer: BC Managed Care – PPO | Admitting: Family Medicine

## 2022-06-11 ENCOUNTER — Telehealth: Payer: BC Managed Care – PPO | Admitting: Emergency Medicine

## 2022-06-11 DIAGNOSIS — J45901 Unspecified asthma with (acute) exacerbation: Secondary | ICD-10-CM | POA: Diagnosis not present

## 2022-06-11 MED ORDER — PREDNISONE 20 MG PO TABS: 40.0000 mg | ORAL_TABLET | Freq: Every day | ORAL | 0 refills | Status: AC

## 2022-06-11 NOTE — Progress Notes (Signed)
We are sorry that you are not feeling well.  Here is how we plan to help!  Based on your presentation I believe you most likely have A cough due to allergies.  I recommend that you start the an over-the counter-allergy medication such as Claritin 10 mg or Zyrtec 10 mg daily.  If you are not already taking it.   I suspect your cough and wheezing is due to your allergies aggravating your asthma. Please continue to use  your albuterol 2 puffs every 4-6 hours if needed for wheezing or shortness of breath. Please continue to use your Trelegy Ellipta daily as prescribed.    I have also prescribed prednisone for you to help your asthma through this time.   From your responses in the eVisit questionnaire you describe inflammation in the upper respiratory tract which is causing a significant cough.  This is commonly called Bronchitis and has four common causes:   Allergies Viral Infections Acid Reflux Bacterial Infection Allergies, viruses and acid reflux are treated by controlling symptoms or eliminating the cause. An example might be a cough caused by taking certain blood pressure medications. You stop the cough by changing the medication. Another example might be a cough caused by acid reflux. Controlling the reflux helps control the cough.  USE OF BRONCHODILATOR ("RESCUE") INHALERS: There is a risk from using your bronchodilator too frequently.  The risk is that over-reliance on a medication which only relaxes the muscles surrounding the breathing tubes can reduce the effectiveness of medications prescribed to reduce swelling and congestion of the tubes themselves.  Although you feel brief relief from the bronchodilator inhaler, your asthma may actually be worsening with the tubes becoming more swollen and filled with mucus.  This can delay other crucial treatments, such as oral steroid medications. If you need to use a bronchodilator inhaler daily, several times per day, you should discuss this with your  provider.  There are probably better treatments that could be used to keep your asthma under control.     HOME CARE Only take medications as instructed by your medical team. Complete the entire course of an antibiotic. Drink plenty of fluids and get plenty of rest. Avoid close contacts especially the very young and the elderly Cover your mouth if you cough or cough into your sleeve. Always remember to wash your hands A steam or ultrasonic humidifier can help congestion.   GET HELP RIGHT AWAY IF: You develop worsening fever. You become short of breath You cough up blood. Your symptoms persist after you have completed your treatment plan MAKE SURE YOU  Understand these instructions. Will watch your condition. Will get help right away if you are not doing well or get worse.    Thank you for choosing an e-visit.  Your e-visit answers were reviewed by a board certified advanced clinical practitioner to complete your personal care plan. Depending upon the condition, your plan could have included both over the counter or prescription medications.  Please review your pharmacy choice. Make sure the pharmacy is open so you can pick up prescription now. If there is a problem, you may contact your provider through [employer] and have the prescription routed to another pharmacy.  Your safety is important to [place]. If you have drug allergies check your prescription carefully.   For the next 24 hours you can use MyChart to ask questions about today's visit, request a non-urgent call back, or ask for a work or school excuse. You will get an  email in the next two days asking about your experience. I hope that your e-visit has been valuable and will speed your recovery.  I have spent 5 minutes in review of e-visit questionnaire, review and updating patient chart, medical decision making and response to patient.   Willeen Cass, PhD, FNP-BC

## 2022-09-15 ENCOUNTER — Telehealth: Payer: BC Managed Care – PPO | Admitting: Nurse Practitioner

## 2022-09-15 DIAGNOSIS — R110 Nausea: Secondary | ICD-10-CM

## 2022-09-15 MED ORDER — ONDANSETRON HCL 4 MG PO TABS: 4.0000 mg | ORAL_TABLET | Freq: Three times a day (TID) | ORAL | 0 refills | Status: DC | PRN

## 2022-09-15 NOTE — Progress Notes (Signed)
E-Visit for Nausea and Vomiting   We are sorry that you are not feeling well. Here is how we plan to help!  Based on what you have shared with me it looks like you have a Virus that is irritating your GI tract.  Vomiting is the forceful emptying of a portion of the stomach's content through the mouth.  Although nausea and vomiting can make you feel miserable, it's important to remember that these are not diseases, but rather symptoms of an underlying illness.  When we treat short term symptoms, we always caution that any symptoms that persist should be fully evaluated in a medical office.  I have prescribed a medication that will help alleviate your symptoms and allow you to stay hydrated:  Zofran 4 mg 1 tablet every 8 hours as needed for nausea and vomiting  HOME CARE: Drink clear liquids.  This is very important! Dehydration (the lack of fluid) can lead to a serious complication.  Start off with 1 tablespoon every 5 minutes for 8 hours. You may begin eating bland foods after 8 hours without vomiting.  Start with saltine crackers, white bread, rice, mashed potatoes, applesauce. After 48 hours on a bland diet, you may resume a normal diet. Try to go to sleep.  Sleep often empties the stomach and relieves the need to vomit.  GET HELP RIGHT AWAY IF:  Your symptoms do not improve or worsen within 2 days after treatment. You have a fever for over 3 days. You cannot keep down fluids after trying the medication.  MAKE SURE YOU:  Understand these instructions. Will watch your condition. Will get help right away if you are not doing well or get worse.    Thank you for choosing an e-visit.  Your e-visit answers were reviewed by a board certified advanced clinical practitioner to complete your personal care plan. Depending upon the condition, your plan could have included both over the counter or prescription medications.  Please review your pharmacy choice. Make sure the pharmacy is open so  you can pick up prescription now. If there is a problem, you may contact your provider through MyChart messaging and have the prescription routed to another pharmacy.  Your safety is important to us. If you have drug allergies check your prescription carefully.   For the next 24 hours you can use MyChart to ask questions about today's visit, request a non-urgent call back, or ask for a work or school excuse. You will get an email in the next two days asking about your experience. I hope that your e-visit has been valuable and will speed your recovery.   Meds ordered this encounter  Medications   ondansetron (ZOFRAN) 4 MG tablet    Sig: Take 1 tablet (4 mg total) by mouth every 8 (eight) hours as needed for nausea or vomiting.    Dispense:  20 tablet    Refill:  0    I spent approximately 5 minutes reviewing the patient's history, current symptoms and coordinating their care today.   

## 2022-10-21 ENCOUNTER — Telehealth: Payer: BC Managed Care – PPO | Admitting: Emergency Medicine

## 2022-10-21 DIAGNOSIS — J069 Acute upper respiratory infection, unspecified: Secondary | ICD-10-CM

## 2022-10-21 MED ORDER — FLUTICASONE PROPIONATE 50 MCG/ACT NA SUSP: 2.0000 | Freq: Every day | NASAL | 0 refills | Status: AC

## 2022-10-21 MED ORDER — BENZONATATE 100 MG PO CAPS: 100.0000 mg | ORAL_CAPSULE | Freq: Two times a day (BID) | ORAL | 0 refills | Status: DC | PRN

## 2022-10-21 NOTE — Progress Notes (Signed)

## 2022-11-29 ENCOUNTER — Encounter: Payer: Self-pay | Admitting: *Deleted

## 2023-02-14 ENCOUNTER — Ambulatory Visit: Payer: BC Managed Care – PPO | Admitting: Family Medicine

## 2023-02-14 VITALS — BP 160/100 | HR 100 | Temp 98.3°F | Resp 18 | Ht 63.0 in | Wt 236.0 lb

## 2023-02-14 DIAGNOSIS — J029 Acute pharyngitis, unspecified: Secondary | ICD-10-CM | POA: Diagnosis not present

## 2023-02-14 DIAGNOSIS — R0981 Nasal congestion: Secondary | ICD-10-CM

## 2023-02-14 DIAGNOSIS — M797 Fibromyalgia: Secondary | ICD-10-CM

## 2023-02-14 LAB — POC COVID19 BINAXNOW: SARS Coronavirus 2 Ag: NEGATIVE

## 2023-02-14 LAB — POCT RAPID STREP A (OFFICE): Rapid Strep A Screen: NEGATIVE

## 2023-02-14 MED ORDER — CYCLOBENZAPRINE HCL 10 MG PO TABS: 10.0000 mg | ORAL_TABLET | Freq: Two times a day (BID) | ORAL | 0 refills | Status: DC | PRN

## 2023-02-14 MED ORDER — AZITHROMYCIN 250 MG PO TABS
ORAL_TABLET | ORAL | 0 refills | Status: DC
Start: 2023-02-14 — End: 2024-03-05

## 2023-02-14 NOTE — Assessment & Plan Note (Signed)
Zyrtec and flonase  

## 2023-02-14 NOTE — Progress Notes (Signed)
Established Patient Office Visit  Subjective   Patient ID: Jane Fletcher, female    DOB: 1975/08/28  Age: 47 y.o. MRN: [id number]  Chief Complaint  Patient presents with   Sore Throat    Sxs started Sunday morning and states having a coworker with strep. Pt states having headache and fatigue.     HPI Discussed the use of AI scribe software for clinical note transcription with the patient, who gave verbal consent to proceed.  History of Present Illness   The patient, with a history of fibromyalgia, presents with upper respiratory symptoms that started on Sunday. They were exposed to a co-worker who tested positive for strep throat. They describe muscle aches and slight congestion. They also report a feeling of fluid in their ear, which comes and goes. They have not taken anything for their symptoms. They also mention that their fibromyalgia has been triggered by their current illness and they have run out of their Flexeril medication.      Patient Active Problem List   Diagnosis Date Noted   Sore throat 02/14/2023   Nasal congestion 02/14/2023   Bronchitis 01/12/2022   Left Achilles tendinitis 02/08/2018   Fibromyalgia 12/30/2014   Obesity 12/30/2014   Past Medical History:  Diagnosis Date   Allergy    Anxiety    Fibromyalgia    Past Surgical History:  Procedure Laterality Date   CHOLECYSTECTOMY     Social History   Tobacco Use   Smoking status: Never   Smokeless tobacco: Never  Vaping Use   Vaping Use: Never used  Substance Use Topics   Alcohol use: No   Drug use: No   Social History   Socioeconomic History   Marital status: Married    Spouse name: Not on file   Number of children: Not on file   Years of education: Not on file   Highest education level: Bachelor's degree (e.g., BA, AB, BS)  Occupational History   Not on file  Tobacco Use   Smoking status: Never   Smokeless tobacco: Never  Vaping Use   Vaping Use: Never used  Substance and Sexual  Activity   Alcohol use: No   Drug use: No   Sexual activity: Never  Other Topics Concern   Not on file  Social History Narrative   Not on file   Social Determinants of Health   Financial Resource Strain: Low Risk  (02/14/2023)   Overall Financial Resource Strain (CARDIA)    Difficulty of Paying Living Expenses: Not hard at all  Food Insecurity: No Food Insecurity (02/14/2023)   Hunger Vital Sign    Worried About Running Out of Food in the Last Year: Never true    Ran Out of Food in the Last Year: Never true  Transportation Needs: No Transportation Needs (02/14/2023)   PRAPARE - Administrator, Civil Service (Medical): No    Lack of Transportation (Non-Medical): No  Physical Activity: Sufficiently Active (02/14/2023)   Exercise Vital Sign    Days of Exercise per Week: 7 days    Minutes of Exercise per Session: 30 min  Stress: Stress Concern Present (02/14/2023)   [employer] of Occupational Health - Occupational Stress Questionnaire    Feeling of Stress : To some extent  Social Connections: Socially Integrated (02/14/2023)   Social Connection and Isolation Panel [NHANES]    Frequency of Communication with Friends and Family: More than three times a week    Frequency of Social Gatherings with Friends  and Family: More than three times a week    Attends Religious Services: 1 to 4 times per year    Active Member of Clubs or Organizations: Yes    Attends Banker Meetings: 1 to 4 times per year    Marital Status: Married  Catering manager Violence: Not on file   Family Status  Relation Name Status   Mother  Alive   Father  Alive   Sister  Alive   Brother  Alive   Daughter  Alive   Sister  Alive   Sister  Alive   Family History  Problem Relation Age of Onset   Hypertension Mother    Mental illness Sister    Migraines Sister    Allergies  Allergen Reactions   Penicillins Hives      Review of Systems  Constitutional:  Negative for chills, fever  and malaise/fatigue.  HENT:  Positive for congestion and sore throat.   Eyes:  Negative for blurred vision.  Respiratory:  Negative for cough and shortness of breath.   Cardiovascular:  Negative for chest pain, palpitations and leg swelling.  Gastrointestinal:  Negative for vomiting.  Musculoskeletal:  Negative for back pain.  Skin:  Negative for rash.  Neurological:  Negative for loss of consciousness and headaches.  Psychiatric/Behavioral:  Negative for depression and memory loss. The patient is not nervous/anxious.       Objective:     BP (!) 160/100 (BP Location: Right Arm, Patient Position: Sitting, Cuff Size: Large)   Pulse 100   Temp 98.3 F (36.8 C) (Oral)   Resp 18   Ht 5\' 3"  (1.6 m)   Wt 236 lb (107 kg)   SpO2 97%   BMI 41.81 kg/m  BP Readings from Last 3 Encounters:  02/14/23 (!) 160/100  01/21/22 140/80  06/18/20 130/90   Wt Readings from Last 3 Encounters:  02/14/23 236 lb (107 kg)  01/21/22 248 lb 9.6 oz (112.8 kg)  06/18/20 240 lb (108.9 kg)   SpO2 Readings from Last 3 Encounters:  02/14/23 97%  01/21/22 97%  06/18/20 99%      Physical Exam Vitals and nursing note reviewed.  Constitutional:      General: She is not in acute distress.    Appearance: Normal appearance.  HENT:     Head: Normocephalic and atraumatic.     Right Ear: Tympanic membrane, ear canal and external ear normal. There is no impacted cerumen.     Left Ear: Tympanic membrane, ear canal and external ear normal. There is no impacted cerumen.     Nose: Nose normal. No congestion or rhinorrhea.     Right Sinus: No maxillary sinus tenderness or frontal sinus tenderness.     Left Sinus: No maxillary sinus tenderness or frontal sinus tenderness.     Mouth/Throat:     Mouth: Mucous membranes are moist.     Pharynx: Oropharyngeal exudate and posterior oropharyngeal erythema present.     Tonsils: No tonsillar exudate.  Eyes:     General: No scleral icterus.       Right eye: No  discharge.        Left eye: No discharge.     Conjunctiva/sclera: Conjunctivae normal.  Cardiovascular:     Rate and Rhythm: Normal rate and regular rhythm.     Heart sounds: Normal heart sounds.  Pulmonary:     Effort: Pulmonary effort is normal. No respiratory distress.     Breath sounds: Normal breath sounds.  Musculoskeletal:     Cervical back: Normal range of motion.  Lymphadenopathy:     Cervical: Cervical adenopathy present.  Skin:    General: Skin is warm and dry.  Neurological:     Mental Status: She is alert and oriented to person, place, and time.  Psychiatric:        Mood and Affect: Mood normal.        Behavior: Behavior normal.        Thought Content: Thought content normal.        Judgment: Judgment normal.      Results for orders placed or performed in visit on 02/14/23  POCT rapid strep A  Result Value Ref Range   Rapid Strep A Screen Negative Negative  POC COVID-19  Result Value Ref Range   SARS Coronavirus 2 Ag Negative Negative    Last CBC Lab Results  Component Value Date   WBC 6.2 06/18/2020   HGB 11.9 06/18/2020   HCT 34.4 (L) 06/18/2020   MCV 93.2 06/18/2020   MCH 32.2 06/18/2020   RDW 12.3 06/18/2020   PLT 236 06/18/2020   Last metabolic panel Lab Results  Component Value Date   GLUCOSE 106 (H) 06/18/2020   NA 142 06/18/2020   K 4.1 06/18/2020   CL 104 06/18/2020   CO2 27 06/18/2020   BUN 13 06/18/2020   CREATININE 0.70 06/18/2020   GFRNONAA >60 04/28/2016   CALCIUM 9.5 06/18/2020   PROT 7.2 06/18/2020   ALBUMIN 4.1 02/01/2018   BILITOT 0.4 06/18/2020   ALKPHOS 49 02/01/2018   AST 11 06/18/2020   ALT 7 06/18/2020   ANIONGAP 6 04/28/2016   Last lipids Lab Results  Component Value Date   CHOL 217 (H) 06/18/2020   HDL 46 (L) 06/18/2020   LDLCALC 134 (H) 06/18/2020   LDLDIRECT 129.0 02/01/2018   TRIG 232 (H) 06/18/2020   CHOLHDL 4.7 06/18/2020   Last hemoglobin A1c Lab Results  Component Value Date   HGBA1C 5.1  06/18/2020   Last thyroid functions Lab Results  Component Value Date   TSH 0.65 06/18/2020   Last vitamin D Lab Results  Component Value Date   VD25OH 11 (L) 06/18/2020   Last vitamin B12 and Folate No results found for: "VITAMINB12", "FOLATE"    The 10-year ASCVD risk score (Arnett DK, et al., 2019) is: 4.2%    Assessment & Plan:   Problem List Items Addressed This Visit       Unprioritized   Fibromyalgia   Relevant Medications   cyclobenzaprine (FLEXERIL) 10 MG tablet   Sore throat - Primary    With strep exposure  Z pak Pt has zyrtec at home and uses flonase       Relevant Medications   azithromycin (ZITHROMAX Z-PAK) 250 MG tablet   Other Relevant Orders   POCT rapid strep A (Completed)   Nasal congestion    Zyrtec and flonase       Relevant Orders   POC COVID-19 (Completed)    No follow-ups on file.    Donato Schultz, DO

## 2023-02-14 NOTE — Assessment & Plan Note (Signed)
With strep exposure  Z pak Pt has zyrtec at home and uses flonase

## 2023-03-31 ENCOUNTER — Telehealth: Payer: BC Managed Care – PPO | Admitting: Nurse Practitioner

## 2023-03-31 DIAGNOSIS — J069 Acute upper respiratory infection, unspecified: Secondary | ICD-10-CM

## 2023-03-31 MED ORDER — IPRATROPIUM BROMIDE 0.03 % NA SOLN
2.0000 | Freq: Two times a day (BID) | NASAL | 12 refills | Status: DC
Start: 2023-03-31 — End: 2024-03-05

## 2023-03-31 MED ORDER — BENZONATATE 100 MG PO CAPS
100.0000 mg | ORAL_CAPSULE | Freq: Three times a day (TID) | ORAL | 0 refills | Status: DC | PRN
Start: 2023-03-31 — End: 2024-03-05

## 2023-03-31 NOTE — Progress Notes (Signed)
E-Visit for Upper Respiratory Infection   We are sorry you are not feeling well.  Here is how we plan to help!  Based on what you have shared with me, it looks like you may have a viral upper respiratory infection.  Upper respiratory infections are caused by a large number of viruses; however, rhinovirus is the most common cause.   Symptoms vary from person to person, with common symptoms including sore throat, cough, fatigue or lack of energy and feeling of general discomfort.  A low-grade fever of up to 100.4 may present, but is often uncommon.  Symptoms vary however, and are closely related to a person's age or underlying illnesses.  The most common symptoms associated with an upper respiratory infection are nasal discharge or congestion, cough, sneezing, headache and pressure in the ears and face.  These symptoms usually persist for about 3 to 10 days, but can last up to 2 weeks.  It is important to know that upper respiratory infections do not cause serious illness or complications in most cases.    Upper respiratory infections can be transmitted from person to person, with the most common method of transmission being a person's hands.  The virus is able to live on the skin and can infect other persons for up to 2 hours after direct contact.  Also, these can be transmitted when someone coughs or sneezes; thus, it is important to cover the mouth to reduce this risk.  To keep the spread of the illness at bay, good hand hygiene is very important.  This is an infection that is most likely caused by a virus. There are no specific treatments other than to help you with the symptoms until the infection runs its course.  We are sorry you are not feeling well.  Here is how we plan to help!   For nasal congestion, you may use an oral decongestants such as Mucinex D or if you have glaucoma or high blood pressure use plain Mucinex.  Saline nasal spray or nasal drops can help and can safely be used as often as  needed for congestion.  For your congestion, I have prescribed Ipratropium Bromide nasal spray 0.03% two sprays in each nostril 2-3 times a day  If you do not have a history of heart disease, hypertension, diabetes or thyroid disease, prostate/bladder issues or glaucoma, you may also use Sudafed to treat nasal congestion.  It is highly recommended that you consult with a pharmacist or your primary care physician to ensure this medication is safe for you to take.     If you have a cough, you may use cough suppressants such as Delsym and Robitussin.  If you have glaucoma or high blood pressure, you can also use Coricidin HBP.   For cough I have prescribed for you A prescription cough medication called Tessalon Perles 100 mg. You may take 1-2 capsules every 8 hours as needed for cough  If you have a sore or scratchy throat, use a saltwater gargle-  to  teaspoon of salt dissolved in a 4-ounce to 8-ounce glass of warm water.  Gargle the solution for approximately 15-30 seconds and then spit.  It is important not to swallow the solution.  You can also use throat lozenges/cough drops and Chloraseptic spray to help with throat pain or discomfort.  Warm or cold liquids can also be helpful in relieving throat pain.  For headache, pain or general discomfort, you can use Ibuprofen or Tylenol as directed.     Some authorities believe that zinc sprays or the use of Echinacea may shorten the course of your symptoms.   HOME CARE Only take medications as instructed by your medical team. Be sure to drink plenty of fluids. Water is fine as well as fruit juices, sodas and electrolyte beverages. You may want to stay away from caffeine or alcohol. If you are nauseated, try taking small sips of liquids. How do you know if you are getting enough fluid? Your urine should be a pale yellow or almost colorless. Get rest. Taking a steamy shower or using a humidifier may help nasal congestion and ease sore throat pain. You can  place a towel over your head and breathe in the steam from hot water coming from a faucet. Using a saline nasal spray works much the same way. Cough drops, hard candies and sore throat lozenges may ease your cough. Avoid close contacts especially the very young and the elderly Cover your mouth if you cough or sneeze Always remember to wash your hands.   GET HELP RIGHT AWAY IF: You develop worsening fever. If your symptoms do not improve within 10 days You develop yellow or green discharge from your nose over 3 days. You have coughing fits You develop a severe head ache or visual changes. You develop shortness of breath, difficulty breathing or start having chest pain Your symptoms persist after you have completed your treatment plan  MAKE SURE YOU  Understand these instructions. Will watch your condition. Will get help right away if you are not doing well or get worse.  Thank you for choosing an e-visit.  Your e-visit answers were reviewed by a board certified advanced clinical practitioner to complete your personal care plan. Depending upon the condition, your plan could have included both over the counter or prescription medications.  Please review your pharmacy choice. Make sure the pharmacy is open so you can pick up prescription now. If there is a problem, you may contact your provider through MyChart messaging and have the prescription routed to another pharmacy.  Your safety is important to us. If you have drug allergies check your prescription carefully.   For the next 24 hours you can use MyChart to ask questions about today's visit, request a non-urgent call back, or ask for a work or school excuse. You will get an email in the next two days asking about your experience. I hope that your e-visit has been valuable and will speed your recovery.  Meds ordered this encounter  Medications   benzonatate (TESSALON) 100 MG capsule    Sig: Take 1 capsule (100 mg total) by mouth 3  (three) times daily as needed.    Dispense:  30 capsule    Refill:  0   ipratropium (ATROVENT) 0.03 % nasal spray    Sig: Place 2 sprays into both nostrils every 12 (twelve) hours.    Dispense:  30 mL    Refill:  12    I spent approximately 5 minutes reviewing the patient's history, current symptoms and coordinating their care today.   

## 2023-06-03 ENCOUNTER — Other Ambulatory Visit: Payer: Self-pay | Admitting: Family Medicine

## 2023-06-03 ENCOUNTER — Encounter: Payer: Self-pay | Admitting: Family Medicine

## 2023-06-03 DIAGNOSIS — M797 Fibromyalgia: Secondary | ICD-10-CM

## 2023-06-03 DIAGNOSIS — Z20822 Contact with and (suspected) exposure to covid-19: Secondary | ICD-10-CM

## 2023-06-03 DIAGNOSIS — J4541 Moderate persistent asthma with (acute) exacerbation: Secondary | ICD-10-CM

## 2023-06-03 MED ORDER — CYCLOBENZAPRINE HCL 10 MG PO TABS
10.0000 mg | ORAL_TABLET | Freq: Two times a day (BID) | ORAL | 0 refills | Status: DC | PRN
Start: 2023-06-03 — End: 2024-07-11

## 2023-06-03 MED ORDER — ALBUTEROL SULFATE HFA 108 (90 BASE) MCG/ACT IN AERS
2.0000 | INHALATION_SPRAY | Freq: Four times a day (QID) | RESPIRATORY_TRACT | 2 refills | Status: AC | PRN
Start: 2023-06-03 — End: ?

## 2023-06-03 MED ORDER — TRELEGY ELLIPTA 100-62.5-25 MCG/ACT IN AEPB
1.0000 | INHALATION_SPRAY | Freq: Every day | RESPIRATORY_TRACT | 2 refills | Status: DC
Start: 2023-06-03 — End: 2024-03-05

## 2024-02-02 ENCOUNTER — Encounter: Payer: Self-pay | Admitting: Family Medicine

## 2024-02-08 ENCOUNTER — Ambulatory Visit
Admission: EM | Admit: 2024-02-08 | Discharge: 2024-02-08 | Disposition: A | Discharge status: Home / Self Care | Attending: Family Medicine | Admitting: Family Medicine

## 2024-02-08 ENCOUNTER — Telehealth

## 2024-02-08 ENCOUNTER — Other Ambulatory Visit: Payer: Self-pay

## 2024-02-08 DIAGNOSIS — W57XXXA Bitten or stung by nonvenomous insect and other nonvenomous arthropods, initial encounter: Secondary | ICD-10-CM

## 2024-02-08 DIAGNOSIS — S80861A Insect bite (nonvenomous), right lower leg, initial encounter: Secondary | ICD-10-CM | POA: Diagnosis not present

## 2024-02-08 DIAGNOSIS — R21 Rash and other nonspecific skin eruption: Secondary | ICD-10-CM

## 2024-02-08 DIAGNOSIS — T7840XA Allergy, unspecified, initial encounter: Secondary | ICD-10-CM

## 2024-02-08 MED ORDER — DOXYCYCLINE HYCLATE 100 MG PO CAPS: 100.0000 mg | ORAL_CAPSULE | Freq: Two times a day (BID) | ORAL | 0 refills | Status: DC

## 2024-02-08 NOTE — Discharge Instructions (Signed)
 Take antihistamines if you are bothered by itching.  You may take Benadryl  at night and something like Zyrtec  or Claritin during the day Take the antibiotic doxycycline  2 times a day for a week Call or return if not improving in a few days

## 2024-02-08 NOTE — ED Triage Notes (Signed)
 Sunday morning noticed insect bite to right upper thigh, noticed bull's eye shape around it. Started feeling feverish this morning. Soreness is spreading up to right hip. Has had aleve.

## 2024-02-08 NOTE — ED Provider Notes (Signed)
 [facility]    CSN: [id number] Arrival date & time: 02/08/24  0835      History   Chief Complaint Chief Complaint  Patient presents with   Insect Bite    HPI Jane Fletcher is a 48 y.o. female.   HPI  4 days ago had some itching discomfort on her right thigh had noticed an insect bite.  She states at first it was a hard knot that she describes about the size of a quarter.  She states that ever since then she has noticed increasing discoloration surrounding the bite.  Concern it could be a spider bite.  Concern it could be erythema migrans.  She states this morning she started feeling some aches and chills although her temperature is normal.  States otherwise well  Past Medical History:  Diagnosis Date   Allergy    Anxiety    Fibromyalgia     Patient Active Problem List   Diagnosis Date Noted   Sore throat 02/14/2023   Nasal congestion 02/14/2023   Bronchitis 01/12/2022   Left Achilles tendinitis 02/08/2018   Fibromyalgia 12/30/2014   Obesity 12/30/2014    Past Surgical History:  Procedure Laterality Date   CHOLECYSTECTOMY      OB History   No obstetric history on file.      Home Medications    Prior to Admission medications   Medication Sig Start Date End Date Taking? Authorizing Provider  doxycycline  (VIBRAMYCIN ) 100 MG capsule Take 1 capsule (100 mg total) by mouth 2 (two) times daily. 02/08/24  Yes Maranda Jamee Jacob, MD  albuterol  (VENTOLIN  HFA) 108 [phone] Base) MCG/ACT inhaler Inhale 2 puffs into the lungs every 6 (six) hours as needed for wheezing or shortness of breath. 06/03/23   Copland, Harlene BROCKS, MD  azithromycin  (ZITHROMAX  Z-PAK) 250 MG tablet As directed 02/14/23   Antonio Meth, Trent Theisen R, DO  benzonatate  (TESSALON ) 100 MG capsule Take 1 capsule (100 mg total) by mouth 3 (three) times daily as needed. 03/31/23   Kennyth Domino, FNP  cetirizine  (ZYRTEC ) 10 MG tablet Take 1 tablet (10 mg total) by mouth daily. 06/13/17   Saguier, [place], PA-C   Cholecalciferol (VITAMIN D ) 50 MCG (2000 UT) CAPS Take by mouth.    [provider]  cyclobenzaprine  (FLEXERIL ) 10 MG tablet Take 1 tablet (10 mg total) by mouth 2 (two) times daily as needed for muscle spasms. 06/03/23   Copland, Harlene BROCKS, MD  fluticasone  (FLONASE ) 50 MCG/ACT nasal spray Place 2 sprays into both nostrils daily. 10/21/22   Vicky Charleston, PA-C  Fluticasone -Umeclidin-Vilant (TRELEGY ELLIPTA ) 100-62.5-25 MCG/ACT AEPB Inhale 1 puff into the lungs daily. 06/03/23   Copland, Jessica C, MD  ipratropium (ATROVENT ) 0.03 % nasal spray Place 2 sprays into both nostrils every 12 (twelve) hours. 03/31/23   Kennyth Domino, FNP  naproxen sodium (ANAPROX) 220 MG tablet Take 220 mg by mouth 2 (two) times daily with a meal.    [provider]  ondansetron  (ZOFRAN ) 4 MG tablet Take 1 tablet (4 mg total) by mouth every 8 (eight) hours as needed for nausea or vomiting. 09/15/22   Kennyth Domino, FNP    Family History Family History  Problem Relation Age of Onset   Hypertension Mother    Mental illness Sister    Migraines Sister     Social History Social History   Tobacco Use   Smoking status: Never   Smokeless tobacco: Never  Vaping Use   Vaping status: Never Used  Substance Use  Topics   Alcohol use: No   Drug use: No     Allergies   Penicillins   Review of Systems Review of Systems See HPI  Physical Exam Triage Vital Signs ED Triage Vitals  Encounter Vitals Group     BP 02/08/24 0846 (!) 184/144     Girls Systolic BP Percentile --      Girls Diastolic BP Percentile --      Boys Systolic BP Percentile --      Boys Diastolic BP Percentile --      Pulse Rate 02/08/24 0846 98     Resp 02/08/24 0846 16     Temp 02/08/24 0846 99.6 F (37.6 C)     Temp src --      SpO2 02/08/24 0846 94 %     Weight --      Height --      Head Circumference --      Peak Flow --      Pain Score 02/08/24 0849 6     Pain Loc --      Pain Education --      Exclude from  Growth Chart --    No data found.  Updated Vital Signs BP (!) 160/96   Pulse 84   Temp 99.6 F (37.6 C)   Resp 16   LMP 01/16/2024 (Exact Date)   SpO2 94%       Physical Exam Constitutional:      General: She is not in acute distress.    Appearance: She is well-developed.  HENT:     Head: Normocephalic and atraumatic.   Eyes:     Conjunctiva/sclera: Conjunctivae normal.     Pupils: Pupils are equal, round, and reactive to light.    Cardiovascular:     Rate and Rhythm: Normal rate.  Pulmonary:     Effort: Pulmonary effort is normal. No respiratory distress.   Musculoskeletal:        General: Normal range of motion.     Cervical back: Normal range of motion.   Skin:    General: Skin is warm and dry.     Findings: Lesion present. No rash.     Comments: Upper right thigh lateral aspect there is a 1 cm superficial eschar, likely from scratching.  Surrounding this there is a ecchymosis, ring-shaped, approximately 4 cm across.  There are still some slight induration to the skin.   Neurological:     Mental Status: She is alert.      UC Treatments / Results  Labs (all labs ordered are listed, but only abnormal results are displayed) Labs Reviewed - No data to display  EKG   Radiology No results found.  Procedures Procedures (including critical care time)  Medications Ordered in UC Medications - No data to display  Initial Impression / Assessment and Plan / UC Course  I have reviewed the triage vital signs and the nursing notes.  Pertinent labs & imaging results that were available during my care of the patient were reviewed by me and considered in my medical decision making (see chart for details).     This looks more like bruising than rash, however it is unusual.  Patient did not see the insect.  Out of the utmost of caution we will treat her with doxycycline . Final Clinical Impressions(s) / UC Diagnoses   Final diagnoses:  Allergic reaction,  initial encounter  Insect bite of right lower extremity, initial encounter  Rash  Discharge Instructions      Take antihistamines if you are bothered by itching.  You may take Benadryl  at night and something like Zyrtec  or Claritin during the day Take the antibiotic doxycycline  2 times a day for a week Call or return if not improving in a few days   ED Prescriptions     Medication Sig Dispense Auth. Provider   doxycycline  (VIBRAMYCIN ) 100 MG capsule Take 1 capsule (100 mg total) by mouth 2 (two) times daily. 14 capsule Maranda Jamee Jacob, MD      PDMP not reviewed this encounter.   Maranda Jamee Jacob, MD 02/08/24 [id number]

## 2024-03-05 ENCOUNTER — Telehealth: Admitting: Physician Assistant

## 2024-03-05 DIAGNOSIS — G5622 Lesion of ulnar nerve, left upper limb: Secondary | ICD-10-CM | POA: Diagnosis not present

## 2024-03-05 MED ORDER — MELOXICAM 15 MG PO TABS
15.0000 mg | ORAL_TABLET | Freq: Every day | ORAL | 0 refills | Status: AC
Start: 2024-03-05 — End: ?

## 2024-03-05 NOTE — Progress Notes (Signed)
 Virtual Visit Consent   Jane Fletcher, you are scheduled for a virtual visit with a  provider today. Just as with appointments in the office, your consent must be obtained to participate. Your consent will be active for this visit and any virtual visit you may have with one of our providers in the next 365 days. If you have a MyChart account, a copy of this consent can be sent to you electronically.  As this is a virtual visit, video technology does not allow for your provider to perform a traditional examination. This may limit your provider's ability to fully assess your condition. If your provider identifies any concerns that need to be evaluated in person or the need to arrange testing (such as labs, EKG, etc.), we will make arrangements to do so. Although advances in technology are sophisticated, we cannot ensure that it will always work on either your end or our end. If the connection with a video visit is poor, the visit may have to be switched to a telephone visit. With either a video or telephone visit, we are not always able to ensure that we have a secure connection.  By engaging in this virtual visit, you consent to the provision of healthcare and authorize for your insurance to be billed (if applicable) for the services provided during this visit. Depending on your insurance coverage, you may receive a charge related to this service.  I need to obtain your verbal consent now. Are you willing to proceed with your visit today? Jane Fletcher has provided verbal consent on 03/05/2024 for a virtual visit (video or telephone). Jane Fletcher, [place]  Date: 03/05/2024 10:31 AM   Virtual Visit via Video Note   I, Jane Fletcher, connected with  Jane Fletcher  ([id number], 03-21-1976) on 03/05/24 at 10:30 AM EDT by a video-enabled telemedicine application and verified that I am speaking with the correct person using two identifiers.  Location: Patient: Virtual Visit  Location Patient: Home Provider: Virtual Visit Location Provider: Home Office   I discussed the limitations of evaluation and management by telemedicine and the availability of in person appointments. The patient expressed understanding and agreed to proceed.    History of Present Illness: Jane Fletcher is a 48 y.o. who identifies as a female who was assigned female at birth, and is being seen today for pain in Left arm starting over the weekend. Notes Saturday night into Sunday morning having some numbness and tingling in her L arm -- elbow down to her wrist. Thought she slept funny on it but it has continued intermittently now with some pain just above the elbow. Is also noting pain in her wrist without swelling. Denies any residual numbness but occasional tingling in her forearm and hand.   Sunday morning took some old muscle relaxants which she took with some relief.  Got a wrist brace this morning that she started wearing this morning. Aleve OTC this morning with mild relief.   Notes she spends hours typing daily.   HPI: HPI  Problems:  Patient Active Problem List   Diagnosis Date Noted   Sore throat 02/14/2023   Nasal congestion 02/14/2023   Bronchitis 01/12/2022   Left Achilles tendinitis 02/08/2018   Fibromyalgia 12/30/2014   Obesity 12/30/2014    Allergies:  Allergies  Allergen Reactions   Penicillins Hives   Medications:  Current Outpatient Medications:    meloxicam  (MOBIC ) 15 MG tablet, Take 1 tablet (15 mg total) by mouth daily., Disp: 30 tablet,  Rfl: 0   albuterol  (VENTOLIN  HFA) 108 (90 Base) MCG/ACT inhaler, Inhale 2 puffs into the lungs every 6 (six) hours as needed for wheezing or shortness of breath., Disp: 18 g, Rfl: 2   cetirizine  (ZYRTEC ) 10 MG tablet, Take 1 tablet (10 mg total) by mouth daily., Disp: 90 tablet, Rfl: 3   Cholecalciferol (VITAMIN D ) 50 MCG (2000 UT) CAPS, Take by mouth., Disp: , Rfl:    cyclobenzaprine  (FLEXERIL ) 10 MG tablet, Take 1 tablet (10  mg total) by mouth 2 (two) times daily as needed for muscle spasms., Disp: 30 tablet, Rfl: 0   fluticasone  (FLONASE ) 50 MCG/ACT nasal spray, Place 2 sprays into both nostrils daily., Disp: 9.9 mL, Rfl: 0  Observations/Objective: Patient is well-developed, well-nourished in no acute distress.  Resting comfortably at home.  Head is normocephalic, atraumatic.  No labored breathing.  Speech is clear and coherent with logical content.  Patient is alert and oriented at baseline.  Normal ROM of L elbow and wrist demonstrated.  Assessment and Plan: 1. Ulnar nerve impingement, left (Primary) - meloxicam  (MOBIC ) 15 MG tablet; Take 1 tablet (15 mg total) by mouth daily.  Dispense: 30 tablet; Refill: 0  Concern for ulnar nerve impingement/irritation in elbow and wrist. Continue compression. Positioning discussed. Mobic  once daily with foods. Tylenol  OTC. Monitor over next few days. If not easing up or any new/worsening symptoms, will require an in-person evaluation.  Follow Up Instructions: I discussed the assessment and treatment plan with the patient. The patient was provided an opportunity to ask questions and all were answered. The patient agreed with the plan and demonstrated an understanding of the instructions.  A copy of instructions were sent to the patient via MyChart unless otherwise noted below.   The patient was advised to call back or seek an in-person evaluation if the symptoms worsen or if the condition fails to improve as anticipated.    Jane Velma Lunger, PA-C

## 2024-03-05 NOTE — Patient Instructions (Signed)
  Gabrianna Wiedeman, thank you for joining Elsie Velma Lunger, PA-C for today's virtual visit.  While this provider is not your primary care provider (PCP), if your PCP is located in our provider database this encounter information will be shared with them immediately following your visit.   A [facility] MyChart account gives you access to today's visit and all your visits, tests, and labs performed at [facility]  click here if you don't have a [facility] MyChart account or go to [email or web address]  Consent: (Patient) Genean Petrovich provided verbal consent for this virtual visit at the beginning of the encounter.  Current Medications:  Current Outpatient Medications:    meloxicam  (MOBIC ) 15 MG tablet, Take 1 tablet (15 mg total) by mouth daily., Disp: 30 tablet, Rfl: 0   albuterol  (VENTOLIN  HFA) 108 (90 Base) MCG/ACT inhaler, Inhale 2 puffs into the lungs every 6 (six) hours as needed for wheezing or shortness of breath., Disp: 18 g, Rfl: 2   cetirizine  (ZYRTEC ) 10 MG tablet, Take 1 tablet (10 mg total) by mouth daily., Disp: 90 tablet, Rfl: 3   Cholecalciferol (VITAMIN D ) 50 MCG (2000 UT) CAPS, Take by mouth., Disp: , Rfl:    cyclobenzaprine  (FLEXERIL ) 10 MG tablet, Take 1 tablet (10 mg total) by mouth 2 (two) times daily as needed for muscle spasms., Disp: 30 tablet, Rfl: 0   fluticasone  (FLONASE ) 50 MCG/ACT nasal spray, Place 2 sprays into both nostrils daily., Disp: 9.9 mL, Rfl: 0   Medications ordered in this encounter:  Meds ordered this encounter  Medications   meloxicam  (MOBIC ) 15 MG tablet    Sig: Take 1 tablet (15 mg total) by mouth daily.    Dispense:  30 tablet    Refill:  0    Supervising Provider:   LAMPTEY, PHILIP O [[id number]]     *If you need refills on other medications prior to your next appointment, please contact your pharmacy*  Follow-Up: Call back or seek an in-person evaluation if the symptoms worsen or if the condition fails to improve as  anticipated.  [facility] [phone]  Other Instructions Concern for ulnar nerve impingement/irritation in elbow and wrist.  Continue compression with wrist brace. Elevate and extend the arm fully when resting. Take the Mobic  once daily with foods. Tylenol  OTC. Monitor over next few days. If not easing up or any new/worsening symptoms, will require an in-person evaluation.   If you have been instructed to have an in-person evaluation today at a local Urgent Care facility, please use the link below. It will take you to a list of all of our available [facility], including address, phone number and hours of operation. Please do not delay care.  [facility]  If you or a family member do not have a primary care provider, use the link below to schedule a visit and establish care. When you choose a [facility] primary care physician or advanced practice provider, you gain a long-term partner in health. Find a Primary Care Provider  Learn more about [facility]'s in-office and virtual care options: [facility] - Get Care Now

## 2024-07-09 NOTE — Progress Notes (Unsigned)
 [facility] [address] [place], [place] [address] [phone]  Date:  07/11/2024   Name:  Jane Fletcher   DOB:  May 05, 1976   MRN:  [id number]  PCP:  Jane Harlene BROCKS, MD    Chief Complaint: No chief complaint on file.   History of Present Illness:  Jane Fletcher is a 48 y.o. very pleasant female patient who presents with the following:  Patient seen today for physical exam-I saw her most recently in 2021.  She has been seen a few times in my office in the interim by Dr. Antonio Meth She has history of mild sleep apnea, fibromyalgia, obesity, asthma  Pap smear Flu shot COVID booster Pneumonia vaccine Mammogram Colon cancer screening  Discussed the use of AI scribe software for clinical note transcription with the patient, who gave verbal consent to proceed.  History of Present Illness     Patient Active Problem List   Diagnosis Date Noted   Sore throat 02/14/2023   Nasal congestion 02/14/2023   Bronchitis 01/12/2022   Left Achilles tendinitis 02/08/2018   Fibromyalgia 12/30/2014   Obesity 12/30/2014    Past Medical History:  Diagnosis Date   Allergy    Anxiety    Fibromyalgia     Past Surgical History:  Procedure Laterality Date   CHOLECYSTECTOMY      Social History   Tobacco Use   Smoking status: Never   Smokeless tobacco: Never  Vaping Use   Vaping status: Never Used  Substance Use Topics   Alcohol use: No   Drug use: No    Family History  Problem Relation Age of Onset   Hypertension Mother    Mental illness Sister    Migraines Sister     Allergies  Allergen Reactions   Penicillins Hives    Medication list has been reviewed and updated.  Current Outpatient Medications on File Prior to Visit  Medication Sig Dispense Refill   albuterol  (VENTOLIN  HFA) 108 (90 Base) MCG/ACT inhaler Inhale 2 puffs into the lungs every 6 (six) hours as needed for wheezing or  shortness of breath. 18 g 2   cetirizine  (ZYRTEC ) 10 MG tablet Take 1 tablet (10 mg total) by mouth daily. 90 tablet 3   Cholecalciferol (VITAMIN D ) 50 MCG (2000 UT) CAPS Take by mouth.     cyclobenzaprine  (FLEXERIL ) 10 MG tablet Take 1 tablet (10 mg total) by mouth 2 (two) times daily as needed for muscle spasms. 30 tablet 0   fluticasone  (FLONASE ) 50 MCG/ACT nasal spray Place 2 sprays into both nostrils daily. 9.9 mL 0   meloxicam  (MOBIC ) 15 MG tablet Take 1 tablet (15 mg total) by mouth daily. 30 tablet 0   No current facility-administered medications on file prior to visit.    Review of Systems:  As per HPI- otherwise negative.   Physical Examination: There were no vitals filed for this visit. There were no vitals filed for this visit. There is no height or weight on file to calculate BMI. Ideal Body Weight:    GEN: no acute distress. HEENT: Atraumatic, Normocephalic.  Ears and Nose: No external deformity. CV: RRR, No M/G/R. No JVD. No thrill. No extra heart sounds. PULM: CTA B, no wheezes, crackles, rhonchi. No retractions. No resp. distress. No accessory muscle use. ABD: S, NT, ND, +BS. No rebound. No HSM. EXTR: No c/c/e PSYCH: Normally interactive. Conversant.    Assessment and Plan: No diagnosis found.  Assessment & Plan   Signed Harlene Schroeder, MD

## 2024-07-11 ENCOUNTER — Other Ambulatory Visit (HOSPITAL_COMMUNITY)
Admission: RE | Admit: 2024-07-11 | Discharge: 2024-07-11 | Disposition: A | Source: Ambulatory Visit | Attending: Family Medicine | Admitting: Family Medicine

## 2024-07-11 ENCOUNTER — Ambulatory Visit (INDEPENDENT_AMBULATORY_CARE_PROVIDER_SITE_OTHER): Admitting: Family Medicine

## 2024-07-11 VITALS — BP 144/102 | HR 99 | Temp 98.0°F | Ht 63.0 in | Wt 245.6 lb

## 2024-07-11 DIAGNOSIS — Z1211 Encounter for screening for malignant neoplasm of colon: Secondary | ICD-10-CM

## 2024-07-11 DIAGNOSIS — R0602 Shortness of breath: Secondary | ICD-10-CM

## 2024-07-11 DIAGNOSIS — Z1329 Encounter for screening for other suspected endocrine disorder: Secondary | ICD-10-CM

## 2024-07-11 DIAGNOSIS — M797 Fibromyalgia: Secondary | ICD-10-CM | POA: Diagnosis not present

## 2024-07-11 DIAGNOSIS — Z130 Encounter for screening for diseases of the blood and blood-forming organs and certain disorders involving the immune mechanism: Secondary | ICD-10-CM

## 2024-07-11 DIAGNOSIS — Z124 Encounter for screening for malignant neoplasm of cervix: Secondary | ICD-10-CM | POA: Diagnosis not present

## 2024-07-11 DIAGNOSIS — Z131 Encounter for screening for diabetes mellitus: Secondary | ICD-10-CM

## 2024-07-11 DIAGNOSIS — Z1231 Encounter for screening mammogram for malignant neoplasm of breast: Secondary | ICD-10-CM

## 2024-07-11 DIAGNOSIS — Z13220 Encounter for screening for lipoid disorders: Secondary | ICD-10-CM

## 2024-07-11 DIAGNOSIS — Z0000 Encounter for general adult medical examination without abnormal findings: Secondary | ICD-10-CM | POA: Diagnosis not present

## 2024-07-11 DIAGNOSIS — I10 Essential (primary) hypertension: Secondary | ICD-10-CM

## 2024-07-11 MED ORDER — CYCLOBENZAPRINE HCL 10 MG PO TABS: 10.0000 mg | ORAL_TABLET | Freq: Two times a day (BID) | ORAL | 0 refills | Status: AC | PRN

## 2024-07-11 MED ORDER — AMLODIPINE BESYLATE 5 MG PO TABS
5.0000 mg | ORAL_TABLET | Freq: Every day | ORAL | 1 refills | Status: AC
Start: 2024-07-11 — End: ?

## 2024-07-11 NOTE — Patient Instructions (Addendum)
 Good to see you today- I will be in touch with your labs and your pap Please do get your flu shot this fall!   We will set you up with GI to start your colon cancer screening Please stop by imaging on the ground floor to set up your mammogram   Let's have you get a BP cuff for home use and start on amlodipine  5 mg daily for your BP.  Goal less than 140/85  Please go by [facility] for a chest x-ray at your convenience [address] [place], [place], [place] [address]  Phone:[phone] I will set you up for a treadmill test as well

## 2024-07-12 ENCOUNTER — Encounter: Payer: Self-pay | Admitting: Family Medicine

## 2024-07-12 DIAGNOSIS — N920 Excessive and frequent menstruation with regular cycle: Secondary | ICD-10-CM

## 2024-07-12 LAB — COMPREHENSIVE METABOLIC PANEL WITH GFR
AG Ratio: 1.3 (calc) (ref 1.0–2.5)
ALT: 9 U/L (ref 6–29)
AST: 12 U/L (ref 10–35)
Albumin: 4 g/dL (ref 3.6–5.1)
Alkaline phosphatase (APISO): 48 U/L (ref 31–125)
BUN: 12 mg/dL (ref 7–25)
CO2: 28 mmol/L (ref 20–32)
Calcium: 9.6 mg/dL (ref 8.6–10.2)
Chloride: 105 mmol/L (ref 98–110)
Creat: 0.69 mg/dL (ref 0.50–0.99)
Globulin: 3.2 g/dL (ref 1.9–3.7)
Glucose, Bld: 106 mg/dL — ABNORMAL HIGH (ref 65–99)
Potassium: 4.1 mmol/L (ref 3.5–5.3)
Sodium: 142 mmol/L (ref 135–146)
Total Bilirubin: 0.4 mg/dL (ref 0.2–1.2)
Total Protein: 7.2 g/dL (ref 6.1–8.1)
eGFR: 108 mL/min/1.73m2 (ref >=60)

## 2024-07-12 LAB — LIPID PANEL
Cholesterol: 227 mg/dL — ABNORMAL HIGH (ref <200)
HDL: 55 mg/dL (ref >=50)
LDL Cholesterol (Calc): 146 mg/dL — ABNORMAL HIGH
Non-HDL Cholesterol (Calc): 172 mg/dL — ABNORMAL HIGH (ref <130)
Total CHOL/HDL Ratio: 4.1 (calc) (ref <5.0)
Triglycerides: 133 mg/dL (ref <150)

## 2024-07-12 LAB — CBC
HCT: 32.8 % — ABNORMAL LOW (ref 35.9–46.0)
Hemoglobin: 10.5 g/dL — ABNORMAL LOW (ref 11.7–15.5)
MCH: 26.9 pg — ABNORMAL LOW (ref 27.0–33.0)
MCHC: 32 g/dL (ref 31.6–35.4)
MCV: 83.9 fL (ref 81.4–101.7)
MPV: 11.3 fL (ref 7.5–12.5)
Platelets: 280 Thousand/uL (ref 140–400)
RBC: 3.91 Million/uL (ref 3.80–5.10)
RDW: 14.4 % (ref 11.0–15.0)
WBC: 7.1 Thousand/uL (ref 3.8–10.8)

## 2024-07-12 LAB — HEMOGLOBIN A1C
Hgb A1c MFr Bld: 5.5 % (ref <5.7)
Mean Plasma Glucose: 111 mg/dL
eAG (mmol/L): 6.2 mmol/L

## 2024-07-12 LAB — TSH: TSH: 0.73 m[IU]/L

## 2024-07-15 ENCOUNTER — Ambulatory Visit (HOSPITAL_BASED_OUTPATIENT_CLINIC_OR_DEPARTMENT_OTHER)
Admission: RE | Admit: 2024-07-15 | Discharge: 2024-07-15 | Disposition: A | Source: Ambulatory Visit | Attending: Family Medicine | Admitting: Family Medicine

## 2024-07-15 ENCOUNTER — Encounter: Payer: Self-pay | Admitting: Family Medicine

## 2024-07-15 DIAGNOSIS — Z1231 Encounter for screening mammogram for malignant neoplasm of breast: Secondary | ICD-10-CM | POA: Insufficient documentation

## 2024-07-15 LAB — CYTOLOGY - PAP
Comment: NEGATIVE
Diagnosis: NEGATIVE
High risk HPV: NEGATIVE

## 2024-07-24 ENCOUNTER — Encounter (HOSPITAL_COMMUNITY): Payer: Self-pay | Admitting: Family Medicine
